# Patient Record
Sex: Female | Born: 1970 | Race: Black or African American | Hispanic: No | Smoking: Never smoker
Health system: Southern US, Community
[De-identification: ages and names within clinical notes are randomized; demographics above are authoritative.]

## PROBLEM LIST (undated history)

## (undated) DIAGNOSIS — I10 Essential (primary) hypertension: Secondary | ICD-10-CM

## (undated) DIAGNOSIS — D259 Leiomyoma of uterus, unspecified: Secondary | ICD-10-CM

## (undated) DIAGNOSIS — F419 Anxiety disorder, unspecified: Secondary | ICD-10-CM

## (undated) DIAGNOSIS — E785 Hyperlipidemia, unspecified: Secondary | ICD-10-CM

## (undated) DIAGNOSIS — F32A Depression, unspecified: Secondary | ICD-10-CM

## (undated) HISTORY — PX: MYOMECTOMY VAGINAL APPROACH: SUR871

## (undated) HISTORY — DX: Hyperlipidemia, unspecified: E78.5

## (undated) HISTORY — PX: ABDOMINAL HYSTERECTOMY: SHX81

## (undated) HISTORY — PX: TUBAL LIGATION: SHX77

## (undated) HISTORY — PX: OTHER SURGICAL HISTORY: SHX169

---

## 2001-10-13 ENCOUNTER — Emergency Department (HOSPITAL_COMMUNITY): Admission: EM | Admit: 2001-10-13 | Discharge: 2001-10-13 | Payer: Self-pay

## 2002-02-07 ENCOUNTER — Emergency Department (HOSPITAL_COMMUNITY): Admission: EM | Admit: 2002-02-07 | Discharge: 2002-02-07 | Payer: Self-pay | Admitting: Emergency Medicine

## 2003-12-20 ENCOUNTER — Emergency Department (HOSPITAL_COMMUNITY): Admission: EM | Admit: 2003-12-20 | Discharge: 2003-12-20 | Payer: Self-pay | Admitting: Emergency Medicine

## 2004-06-04 ENCOUNTER — Emergency Department (HOSPITAL_COMMUNITY): Admission: EM | Admit: 2004-06-04 | Discharge: 2004-06-04 | Payer: Self-pay | Admitting: *Deleted

## 2010-12-27 ENCOUNTER — Other Ambulatory Visit: Payer: Self-pay | Admitting: Family Medicine

## 2011-02-19 HISTORY — PX: BREAST BIOPSY: SHX20

## 2011-07-25 ENCOUNTER — Other Ambulatory Visit: Payer: Self-pay | Admitting: Radiology

## 2013-07-05 ENCOUNTER — Other Ambulatory Visit (HOSPITAL_COMMUNITY): Payer: Self-pay | Admitting: Obstetrics & Gynecology

## 2013-07-05 DIAGNOSIS — IMO0002 Reserved for concepts with insufficient information to code with codable children: Secondary | ICD-10-CM

## 2013-07-09 ENCOUNTER — Ambulatory Visit (HOSPITAL_COMMUNITY)
Admission: RE | Admit: 2013-07-09 | Discharge: 2013-07-09 | Disposition: A | Discharge status: Home / Self Care | Payer: BC Managed Care – PPO | Source: Ambulatory Visit | Attending: Obstetrics & Gynecology | Admitting: Obstetrics & Gynecology

## 2013-07-09 DIAGNOSIS — Z9851 Tubal ligation status: Secondary | ICD-10-CM | POA: Insufficient documentation

## 2013-07-09 DIAGNOSIS — N971 Female infertility of tubal origin: Secondary | ICD-10-CM | POA: Insufficient documentation

## 2013-07-09 DIAGNOSIS — IMO0002 Reserved for concepts with insufficient information to code with codable children: Secondary | ICD-10-CM

## 2013-07-09 MED ORDER — IOHEXOL 300 MG/ML  SOLN
20.0000 mL | Freq: Once | INTRAMUSCULAR | Status: AC | PRN
  Administered 2013-07-09: 20 mL

## 2013-11-23 ENCOUNTER — Encounter: Payer: Self-pay | Admitting: Family

## 2013-11-23 ENCOUNTER — Ambulatory Visit (INDEPENDENT_AMBULATORY_CARE_PROVIDER_SITE_OTHER): Payer: BC Managed Care – PPO | Admitting: Family

## 2013-11-23 VITALS — BP 120/76 | HR 91 | Ht 62.5 in | Wt 188.4 lb

## 2013-11-23 DIAGNOSIS — L219 Seborrheic dermatitis, unspecified: Secondary | ICD-10-CM

## 2013-11-23 DIAGNOSIS — B356 Tinea cruris: Secondary | ICD-10-CM

## 2013-11-23 MED ORDER — CLOTRIMAZOLE-BETAMETHASONE 1-0.05 % EX CREA: 1.0000 "application " | TOPICAL_CREAM | Freq: Two times a day (BID) | CUTANEOUS | Status: DC

## 2013-11-23 NOTE — Patient Instructions (Signed)
Yeast Infection of the Skin Some yeast on the skin is normal, but sometimes it causes an infection. If you have a yeast infection, it shows up as white or light brown patches on brown skin. You can see it better in the summer on tan skin. It causes light-colored holes in your suntan. It can happen on any area of the body. This cannot be passed from person to person. HOME CARE  Scrub your skin daily with a dandruff shampoo. Your rash may take a couple weeks to get well.  Do not scratch or itch the rash. GET HELP RIGHT AWAY IF:   You get another infection from scratching. The skin may get warm, red, and may ooze fluid.  The infection does not seem to be getting better. MAKE SURE YOU:  Understand these instructions.  Will watch your condition.  Will get help right away if you are not doing well or get worse. Document Released: 01/18/2008 Document Revised: 04/29/2011 Document Reviewed: 01/18/2008 Exit[employer] Patient Information 2015 [employer]. This information is not intended to replace advice given to you by your health care provider. Make sure you discuss any questions you have with your health care provider.  

## 2013-11-23 NOTE — Progress Notes (Signed)
Subjective:    Patient ID: Audrey Bates, female    DOB: [birth year], 43 y.o.   MRN: [id number]  HPI  43 year old african american, nonsmoker, is in today to be established. Has concerns of a rash in her groin area since June. Believes it may be worse after changing detergents. Decsribes as itchy, worse with sweating. Has been apply cortisone cream that has helped some but persist.   See GYN and last appt was in June. Last mammogram was July.   She is employed as a Education officer, museum.   Review of Systems  Constitutional: Negative.   HENT: Negative.   Eyes: Negative.   Respiratory: Negative.   Cardiovascular: Negative.   Gastrointestinal: Negative.   Endocrine: Negative.   Genitourinary: Negative.   Musculoskeletal: Negative.   Skin: Positive for rash.  Allergic/Immunologic: Negative.   Neurological: Negative.   Hematological: Negative.   Psychiatric/Behavioral: Negative.    History reviewed. No pertinent past medical history.  History   Social History  . Marital Status: Married    Spouse Name: N/A    Number of Children: N/A  . Years of Education: N/A   Occupational History  . Not on file.   Social History Main Topics  . Smoking status: Never Smoker   . Smokeless tobacco: Never Used  . Alcohol Use: No  . Drug Use: No  . Sexual Activity: Not on file   Other Topics Concern  . Not on file   Social History Narrative  . No narrative on file    Past Surgical History  Procedure Laterality Date  . Breast surgery      biopsy    Family History  Problem Relation Age of Onset  . Hypertension Mother     Allergies  Allergen Reactions  . Latex   . Penicillins     No current outpatient prescriptions on file prior to visit.   No current facility-administered medications on file prior to visit.    BP 120/76  Pulse 91  Ht 5' 2.5" (1.588 m)  Wt 188 lb 6.4 oz (85.458 kg)  BMI 33.89 kg/m2chart    Objective:   Physical Exam  Constitutional: She is oriented to  person, place, and time. She appears well-developed and well-nourished.  HENT:  Right Ear: External ear normal.  Left Ear: External ear normal.  Nose: Nose normal.  Mouth/Throat: Oropharynx is clear and moist.  Eyes: Conjunctivae and EOM are normal. Pupils are equal, round, and reactive to light.  Neck: Normal range of motion. Neck supple. No thyromegaly present.  Cardiovascular: Normal rate, regular rhythm and normal heart sounds.   Pulmonary/Chest: Effort normal and breath sounds normal.  Abdominal: Soft. Bowel sounds are normal.  Musculoskeletal: Normal range of motion.  Neurological: She is alert and oriented to person, place, and time. She has normal reflexes.  Skin: Skin is warm and dry. Rash noted.  Rash in the groin area that is waxy, well defined, appears consistent with tinea.   Psychiatric: She has a normal mood and affect.          Assessment & Plan:  Audrey Bates was seen today for establish care.  Diagnoses and associated orders for this visit:  Tinea cruris  Seborrheic dermatitis  Other Orders - Discontinue: clotrimazole-betamethasone (LOTRISONE) cream; Apply 1 application topically 2 (two) times daily. - clotrimazole-betamethasone (LOTRISONE) cream; Apply 1 application topically 2 (two) times daily. - clotrimazole-betamethasone (LOTRISONE) cream; Apply 1 application topically 2 (two) times daily.   Call the office with any  questions or concerns. Recheck as scheduled and as needed.

## 2013-11-23 NOTE — Progress Notes (Signed)
Pre visit review using our clinic review tool, if applicable. No additional management support is needed unless otherwise documented below in the visit note. 

## 2013-11-24 DIAGNOSIS — B356 Tinea cruris: Secondary | ICD-10-CM | POA: Insufficient documentation

## 2013-11-25 ENCOUNTER — Telehealth: Payer: Self-pay | Admitting: Family

## 2013-11-25 NOTE — Telephone Encounter (Signed)
Pt wanted to let you know she had a biometric screening today and her BP was 139/99 and 144/99.

## 2013-11-26 NOTE — Telephone Encounter (Signed)
FYI

## 2013-11-26 NOTE — Telephone Encounter (Signed)
Recheck blood pressure 3 times a week and call with readings in 2 weeks.

## 2013-11-26 NOTE — Telephone Encounter (Signed)
Left message to advise pt of note 

## 2013-12-16 ENCOUNTER — Encounter: Payer: Self-pay | Admitting: Family

## 2013-12-20 ENCOUNTER — Encounter: Payer: Self-pay | Admitting: Family

## 2013-12-29 ENCOUNTER — Ambulatory Visit (HOSPITAL_BASED_OUTPATIENT_CLINIC_OR_DEPARTMENT_OTHER)
Admission: RE | Admit: 2013-12-29 | Payer: BC Managed Care – PPO | Source: Ambulatory Visit | Admitting: Obstetrics and Gynecology

## 2013-12-29 ENCOUNTER — Encounter (HOSPITAL_BASED_OUTPATIENT_CLINIC_OR_DEPARTMENT_OTHER): Admission: RE | Payer: Self-pay | Source: Ambulatory Visit

## 2013-12-29 SURGERY — LAPAROSCOPY OPERATIVE
Anesthesia: General

## 2014-02-28 ENCOUNTER — Encounter (HOSPITAL_BASED_OUTPATIENT_CLINIC_OR_DEPARTMENT_OTHER): Payer: Self-pay | Admitting: *Deleted

## 2014-03-01 ENCOUNTER — Encounter (HOSPITAL_BASED_OUTPATIENT_CLINIC_OR_DEPARTMENT_OTHER): Payer: Self-pay | Admitting: *Deleted

## 2014-03-03 ENCOUNTER — Encounter (HOSPITAL_BASED_OUTPATIENT_CLINIC_OR_DEPARTMENT_OTHER): Admission: RE | Payer: Self-pay | Source: Ambulatory Visit

## 2014-03-03 ENCOUNTER — Ambulatory Visit (HOSPITAL_BASED_OUTPATIENT_CLINIC_OR_DEPARTMENT_OTHER)
Admission: RE | Admit: 2014-03-03 | Payer: BLUE CROSS/BLUE SHIELD | Source: Ambulatory Visit | Admitting: Obstetrics and Gynecology

## 2014-03-03 HISTORY — DX: Leiomyoma of uterus, unspecified: D25.9

## 2014-03-03 SURGERY — LAPAROSCOPY OPERATIVE
Anesthesia: General

## 2014-05-26 ENCOUNTER — Ambulatory Visit: Payer: BLUE CROSS/BLUE SHIELD | Admitting: Family Medicine

## 2014-08-04 ENCOUNTER — Encounter: Payer: Self-pay | Admitting: Family Medicine

## 2014-08-04 ENCOUNTER — Ambulatory Visit (INDEPENDENT_AMBULATORY_CARE_PROVIDER_SITE_OTHER): Payer: BLUE CROSS/BLUE SHIELD | Admitting: Family Medicine

## 2014-08-04 VITALS — BP 102/80 | HR 104 | Temp 98.0°F | Ht 62.5 in | Wt 194.6 lb

## 2014-08-04 DIAGNOSIS — N951 Menopausal and female climacteric states: Secondary | ICD-10-CM | POA: Diagnosis not present

## 2014-08-04 DIAGNOSIS — Z7689 Persons encountering health services in other specified circumstances: Secondary | ICD-10-CM

## 2014-08-04 DIAGNOSIS — E785 Hyperlipidemia, unspecified: Secondary | ICD-10-CM | POA: Diagnosis not present

## 2014-08-04 DIAGNOSIS — Z6835 Body mass index (BMI) 35.0-35.9, adult: Secondary | ICD-10-CM

## 2014-08-04 DIAGNOSIS — Z7189 Other specified counseling: Secondary | ICD-10-CM | POA: Diagnosis not present

## 2014-08-04 DIAGNOSIS — R232 Flushing: Secondary | ICD-10-CM

## 2014-08-04 NOTE — Patient Instructions (Addendum)
BEFORE YOU LEAVE: Physical in about 4-6 months - come fasting but drink plenty of water  See your gynecologist about the hotflashes  Please obtain copies of any labs and studies done in the last 6 months and forward a copy to [place]. Thank you.  We recommend the following healthy lifestyle measures: - eat a healthy diet consisting of lots of vegetables, fruits, beans, nuts, seeds, healthy meats such as white chicken and fish and whole grains.  - avoid fried foods, fast food, processed foods, sodas, red meet and other fattening foods.  - get a least 150 minutes of aerobic exercise per week.

## 2014-08-04 NOTE — Progress Notes (Signed)
HPI:  Audrey Bates is here to establish care.  She was going to [facility] ob/gyn for physicals, had fibroid removed Jan 2016 - reports perimenopausal now with hot flashes. On OCP. Had extensive labs and CT of her abd last month (06/2014) during fertility eval and everything was normal. Reports had cholesterol, diabetes screening and thyroid labs all in the last few months and mostly normal other then bad cholesterol mildly elevated. Mildly elevated BP in the past 140s when stress, but in the 120s/70s lately.   ROS negative for unless reported above: fevers, unintentional weight loss, hearing or vision loss, chest pain, palpitations, struggling to breath, hemoptysis, melena, hematochezia, hematuria, falls, loc, si, thoughts of self harm  Past Medical History  Diagnosis Date  . Uterine fibroid   . Hyperlipemia     Past Surgical History  Procedure Laterality Date  . Breast biopsy  2013    fibroadenoma  . Tubal ligation      reanastamosis in 2014    Family History  Problem Relation Age of Onset  . Hypertension Mother     History   Social History  . Marital Status: Married    Spouse Name: N/A  . Number of Children: N/A  . Years of Education: N/A   Social History Main Topics  . Smoking status: Never Smoker   . Smokeless tobacco: Never Used  . Alcohol Use: No  . Drug Use: No  . Sexual Activity: Yes    Birth Control/ Protection: Pill   Other Topics Concern  . 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  clotrimazole-betamethasone (LOTRISONE) cream, Apply 1 application topically 2 (two) times daily., Disp: 45 g, Rfl: 0 .  GILDESS 1/20 1-20 MG-MCG tablet, Take 1 tablet by mouth daily. , Disp: , Rfl: 0  EXAM:  Filed Vitals:   08/04/14 1611  BP: 102/80  Pulse: 104  Temp: 98 F (36.7 C)    Body mass index is 35 kg/(m^2).  GENERAL: vitals reviewed and listed above, alert, oriented, appears well hydrated and in no acute distress  HEENT: atraumatic, conjunttiva clear, no obvious abnormalities on inspection of external nose and ears  NECK: no obvious masses on inspection  LUNGS: clear to auscultation bilaterally, no wheezes, rales or rhonchi, good air movement  CV: HRRR, no peripheral edema  MS: moves all extremities without noticeable abnormality  PSYCH: pleasant and cooperative, no obvious depression or anxiety  ASSESSMENT AND PLAN:  Discussed the following assessment and plan:  Hyperlipemia -advised pt to obtain copies of recent labs for our review  -lifestyle recs -recheck lipids at CPE in 4-6 months  BMI 35.0-35.9,adult -lifestyle recs  Hot flashes -sees gyn  Encounter to establish care -We reviewed the PMH, PSH, FH, SH, Meds and Allergies. -We provided refills for any medications we will prescribe as needed. -We addressed current concerns per orders and patient instructions. -We have asked for records for pertinent exams, studies, vaccines and notes from previous providers. -We have advised patient to follow up per instructions below.   -Patient advised to return or notify a doctor immediately if symptoms worsen or persist or new concerns arise.  Patient Instructions  Please  obtain copies of any labs and studies done in the last 6 months and forward a copy to [place]. Thank you.  We recommend the following healthy lifestyle measures: - eat a healthy diet consisting of lots of vegetables, fruits, beans, nuts, seeds, healthy meats such as white chicken and fish and whole grains.  - avoid fried foods, fast food, processed foods, sodas, red meet and other fattening foods.  - get a least 150 minutes of aerobic exercise per week.        Colin Benton R.

## 2014-08-04 NOTE — Progress Notes (Signed)
Pre visit review using our clinic review tool, if applicable. No additional management support is needed unless otherwise documented below in the visit note. 

## 2014-09-12 ENCOUNTER — Ambulatory Visit (INDEPENDENT_AMBULATORY_CARE_PROVIDER_SITE_OTHER): Payer: BLUE CROSS/BLUE SHIELD | Admitting: Family Medicine

## 2014-09-12 ENCOUNTER — Encounter: Payer: Self-pay | Admitting: Family Medicine

## 2014-09-12 VITALS — BP 128/80 | HR 89 | Temp 98.4°F | Ht 62.5 in | Wt 194.1 lb

## 2014-09-12 DIAGNOSIS — J069 Acute upper respiratory infection, unspecified: Secondary | ICD-10-CM

## 2014-09-12 MED ORDER — HYDROCODONE-HOMATROPINE 5-1.5 MG/5ML PO SYRP: 5.0000 mL | ORAL_SOLUTION | Freq: Every evening | ORAL | Status: DC | PRN

## 2014-09-12 NOTE — Patient Instructions (Signed)

## 2014-09-12 NOTE — Progress Notes (Signed)
Pre visit review using our clinic review tool, if applicable. No additional management support is needed unless otherwise documented below in the visit note. 

## 2014-09-12 NOTE — Progress Notes (Signed)
   HPI:  URI: -started: 5 days ago -symptoms:nasal congestion, sore throat, cough, drainage -denies: fever, SOB, NVD, tooth pain, wheezing -has tried: some otc cough medications -sick contacts/travel/risks: denies flu exposure, tick exposure or or Ebola risks -supervisor has the same -Hx of: hx of PND and cough in the late summer every year  ROS: See pertinent positives and negatives per HPI.  Past Medical History  Diagnosis Date  . Uterine fibroid   . Hyperlipemia     Past Surgical History  Procedure Laterality Date  . Breast biopsy  2013    fibroadenoma  . Tubal ligation      reanastamosis in 2014    Family History  Problem Relation Age of Onset  . Hypertension Mother     History   Social History  . Marital Status: Married    Spouse Name: N/A  . Number of Children: N/A  . Years of Education: N/A   Social History Main Topics  . Smoking status: Never Smoker   . Smokeless tobacco: Never Used  . Alcohol Use: No  . Drug Use: No  . Sexual Activity: Yes    Birth Control/ Protection: Pill   Other Topics Concern  . 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  clotrimazole-betamethasone (LOTRISONE) cream, Apply 1 application topically 2 (two) times daily., Disp: 45 g, Rfl: 0 .  HYDROcodone-homatropine (HYCODAN) 5-1.5 MG/5ML syrup, Take 5 mLs by mouth at bedtime as needed for cough., Disp: 120 mL, Rfl: 0  EXAM:  Filed Vitals:   09/12/14 1330  BP: 128/80  Pulse: 89  Temp: 98.4 F (36.9 C)    Body mass index is 34.91 kg/(m^2).  GENERAL: vitals reviewed and listed above, alert, oriented, appears well hydrated and in no acute distress  HEENT: atraumatic, conjunttiva clear, no obvious abnormalities on inspection of  external nose and ears, normal appearance of ear canals and TMs, clear nasal congestion, mild post oropharyngeal erythema with PND, no tonsillar edema or exudate, no sinus TTP  NECK: no obvious masses on inspection  LUNGS: clear to auscultation bilaterally, no wheezes, rales or rhonchi, good air movement  CV: HRRR, no peripheral edema  MS: moves all extremities without noticeable abnormality  PSYCH: pleasant and cooperative, no obvious depression or anxiety  ASSESSMENT AND PLAN:  Discussed the following assessment and plan:  Acute upper respiratory infection - Plan: HYDROcodone-homatropine (HYCODAN) 5-1.5 MG/5ML syrup  -given HPI and exam findings today, a serious infection or illness is unlikely. We discussed potential etiologies, with VURI being most likely, and advised supportive care and monitoring. We discussed treatment side effects, likely course, antibiotic misuse, transmission, and signs of developing a serious illness. -of course, we advised to return or notify a doctor immediately if symptoms worsen or persist or new concerns arise.    There are no Patient Instructions on file for this visit.   Colin Benton R.

## 2014-10-10 ENCOUNTER — Ambulatory Visit (INDEPENDENT_AMBULATORY_CARE_PROVIDER_SITE_OTHER): Payer: BLUE CROSS/BLUE SHIELD | Admitting: Family Medicine

## 2014-10-10 ENCOUNTER — Ambulatory Visit (INDEPENDENT_AMBULATORY_CARE_PROVIDER_SITE_OTHER)
Admission: RE | Admit: 2014-10-10 | Discharge: 2014-10-10 | Disposition: A | Discharge status: Home / Self Care | Payer: BLUE CROSS/BLUE SHIELD | Source: Ambulatory Visit | Attending: Family Medicine | Admitting: Family Medicine

## 2014-10-10 ENCOUNTER — Encounter: Payer: Self-pay | Admitting: Family Medicine

## 2014-10-10 VITALS — BP 126/78 | HR 90 | Temp 97.5°F | Ht 62.5 in | Wt 195.5 lb

## 2014-10-10 DIAGNOSIS — R059 Cough, unspecified: Secondary | ICD-10-CM

## 2014-10-10 DIAGNOSIS — R05 Cough: Secondary | ICD-10-CM

## 2014-10-10 MED ORDER — PREDNISONE 20 MG PO TABS: 40.0000 mg | ORAL_TABLET | Freq: Every day | ORAL | Status: DC

## 2014-10-10 NOTE — Progress Notes (Signed)
   HPI:  URI: -started:about 1 month ago, seemed to be getting a little better but then worse again the last week -symptoms:nasal congestion, sore throat, cough, cough is the worse, low grade fever last week -denies: SOB, NVD, tooth pain, sinus pain -has tried: cough medicine -sick contacts/travel/risks: denies flu exposure, tick exposure or or Ebola risks   ROS: See pertinent positives and negatives per HPI.  Past Medical History  Diagnosis Date  . Uterine fibroid   . Hyperlipemia     Past Surgical History  Procedure Laterality Date  . Breast biopsy  2013    fibroadenoma  . Tubal ligation      reanastamosis in 2014    Family History  Problem Relation Age of Onset  . Hypertension Mother     Social History   Social History  . Marital Status: Married    Spouse Name: N/A  . Number of Children: N/A  . Years of Education: N/A   Social History Main Topics  . Smoking status: Never Smoker   . Smokeless tobacco: Never Used  . Alcohol Use: No  . Drug Use: No  . Sexual Activity: Yes    Birth Control/ Protection: Pill   Other Topics Concern  . 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  clotrimazole-betamethasone (LOTRISONE) cream, Apply 1 application topically 2 (two) times daily., Disp: 45 g, Rfl: 0 .  predniSONE (DELTASONE) 20 MG tablet, Take 2 tablets (40 mg total) by mouth daily with breakfast., Disp: 8 tablet, Rfl: 0  EXAM:  Filed Vitals:   10/10/14 1455  BP: 126/78  Pulse: 90  Temp: 97.5 F (36.4 C)    Body mass index is 35.17 kg/(m^2).  GENERAL: vitals reviewed and listed above, alert, oriented, appears well hydrated and in no acute distress  HEENT: atraumatic, conjunttiva clear, no obvious abnormalities  on inspection of external nose and ears, normal appearance of ear canals and TMs, clear nasal congestion, mild post oropharyngeal erythema with PND, no tonsillar edema or exudate, no sinus TTP  NECK: no obvious masses on inspection  LUNGS: clear to auscultation bilaterally, no wheezes, rales or rhonchi, good air movement  CV: HRRR, no peripheral edema  MS: moves all extremities without noticeable abnormality  PSYCH: pleasant and cooperative, no obvious depression or anxiety  ASSESSMENT AND PLAN:  Discussed the following assessment and plan:  Cough - Plan: DG Chest 2 View  -suspect allergic or post viral - prednisone -cxr to exclude other -return precautions -of course, we advised to return or notify a doctor immediately if symptoms worsen or persist or new concerns arise.    Patient Instructions  BEFORE YOU LEAVE: -xray sheet  Go get the xray today  Take the prednisone as instructed  Follow up in 1 week if not better or sooner if worse       KIM, HANNAH R.

## 2014-10-10 NOTE — Progress Notes (Signed)
Pre visit review using our clinic review tool, if applicable. No additional management support is needed unless otherwise documented below in the visit note. 

## 2014-10-10 NOTE — Patient Instructions (Signed)
BEFORE YOU LEAVE: -xray sheet  Go get the xray today  Take the prednisone as instructed  Follow up in 1 week if not better or sooner if worse

## 2014-10-17 ENCOUNTER — Ambulatory Visit (INDEPENDENT_AMBULATORY_CARE_PROVIDER_SITE_OTHER): Payer: BLUE CROSS/BLUE SHIELD | Admitting: Family Medicine

## 2014-10-17 ENCOUNTER — Encounter: Payer: Self-pay | Admitting: Family Medicine

## 2014-10-17 VITALS — BP 100/72 | HR 119 | Temp 97.9°F | Ht 62.5 in | Wt 195.1 lb

## 2014-10-17 DIAGNOSIS — J309 Allergic rhinitis, unspecified: Secondary | ICD-10-CM | POA: Diagnosis not present

## 2014-10-17 NOTE — Progress Notes (Signed)
  HPI:  Follow up Bronchitis: -reports doing MUCH better -mild PND, clear nasal congestion, sneezing but much better -denies: cough, sinus pain, SOB, fevers, malaise -cxr normal  ROS: See pertinent positives and negatives per HPI.  Past Medical History  Diagnosis Date  . Uterine fibroid   . Hyperlipemia     Past Surgical History  Procedure Laterality Date  . Breast biopsy  2013    fibroadenoma  . Tubal ligation      reanastamosis in 2014    Family History  Problem Relation Age of Onset  . Hypertension Mother     Social History   Social History  . Marital Status: Married    Spouse Name: N/A  . Number of Children: N/A  . Years of Education: N/A   Social History Main Topics  . Smoking status: Never Smoker   . Smokeless tobacco: Never Used  . Alcohol Use: No  . Drug Use: No  . Sexual Activity: Yes    Birth Control/ Protection: Pill   Other Topics Concern  . 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  clotrimazole-betamethasone (LOTRISONE) cream, Apply 1 application topically 2 (two) times daily., Disp: 45 g, Rfl: 0  EXAM:  Filed Vitals:   10/17/14 1338  BP: 100/72  Pulse: 119  Temp: 97.9 F (36.6 C)    Body mass index is 35.09 kg/(m^2).  GENERAL: vitals reviewed and listed above, alert, oriented, appears well hydrated and in no acute distress  HEENT: atraumatic, conjunttiva clear, no obvious abnormalities on inspection of external nose and ears, ear canals clear, TMs normal, boggy pale turbinates with clear rhinorrhea, normal oropharynx  NECK: no obvious masses on inspection  LUNGS: clear to auscultation bilaterally, no wheezes, rales or rhonchi, good air movement  CV: HRRR, no peripheral edema  MS:  moves all extremities without noticeable abnormality  PSYCH: pleasant and cooperative, no obvious depression or anxiety  ASSESSMENT AND PLAN:  Discussed the following assessment and plan:  Allergic rhinitis, unspecified allergic rhinitis type  -Patient advised to return or notify a doctor immediately if symptoms worsen or persist or new concerns arise.  Patient Instructions  flonase 2 sprays each nostril daily for 1 month, then 1 spray each nostril daily for 1 month  Allegra daily for 2 months  Follow up as needed and for PHYSICAL EXAM in 4 months, come fasting to that appointment, please schedule as you leave if you wish.     Colin Benton R.

## 2014-10-17 NOTE — Patient Instructions (Signed)
flonase 2 sprays each nostril daily for 1 month, then 1 spray each nostril daily for 1 month  Allegra daily for 2 months  Follow up as needed and for PHYSICAL EXAM in 4 months, come fasting to that appointment, please schedule as you leave if you wish.

## 2014-10-17 NOTE — Progress Notes (Signed)
Pre visit review using our clinic review tool, if applicable. No additional management support is needed unless otherwise documented below in the visit note. 

## 2014-11-22 DIAGNOSIS — N840 Polyp of corpus uteri: Secondary | ICD-10-CM | POA: Insufficient documentation

## 2015-03-27 DIAGNOSIS — Z975 Presence of (intrauterine) contraceptive device: Secondary | ICD-10-CM | POA: Insufficient documentation

## 2015-12-19 ENCOUNTER — Emergency Department (HOSPITAL_COMMUNITY): Payer: No Typology Code available for payment source

## 2015-12-19 ENCOUNTER — Encounter (HOSPITAL_COMMUNITY): Payer: Self-pay | Admitting: Emergency Medicine

## 2015-12-19 ENCOUNTER — Emergency Department (HOSPITAL_COMMUNITY)
Admission: EM | Admit: 2015-12-19 | Discharge: 2015-12-19 | Disposition: A | Discharge status: Home / Self Care | Payer: No Typology Code available for payment source | Attending: Emergency Medicine | Admitting: Emergency Medicine

## 2015-12-19 DIAGNOSIS — M545 Low back pain: Secondary | ICD-10-CM | POA: Diagnosis present

## 2015-12-19 DIAGNOSIS — Y92410 Unspecified street and highway as the place of occurrence of the external cause: Secondary | ICD-10-CM | POA: Insufficient documentation

## 2015-12-19 DIAGNOSIS — Z79899 Other long term (current) drug therapy: Secondary | ICD-10-CM | POA: Diagnosis not present

## 2015-12-19 DIAGNOSIS — Y9389 Activity, other specified: Secondary | ICD-10-CM | POA: Diagnosis not present

## 2015-12-19 DIAGNOSIS — Y999 Unspecified external cause status: Secondary | ICD-10-CM | POA: Diagnosis not present

## 2015-12-19 DIAGNOSIS — I10 Essential (primary) hypertension: Secondary | ICD-10-CM | POA: Diagnosis not present

## 2015-12-19 HISTORY — DX: Essential (primary) hypertension: I10

## 2015-12-19 LAB — POC URINE PREG, ED: Preg Test, Ur: NEGATIVE

## 2015-12-19 MED ORDER — NAPROXEN 500 MG PO TABS: 500.0000 mg | ORAL_TABLET | Freq: Two times a day (BID) | ORAL | 0 refills | Status: DC

## 2015-12-19 MED ORDER — CYCLOBENZAPRINE HCL 5 MG PO TABS: 5.0000 mg | ORAL_TABLET | Freq: Three times a day (TID) | ORAL | 0 refills | Status: DC | PRN

## 2015-12-19 MED ORDER — IBUPROFEN 200 MG PO TABS
600.0000 mg | ORAL_TABLET | Freq: Once | ORAL | Status: AC
  Administered 2015-12-19: 600 mg via ORAL
  Filled 2015-12-19: qty 3
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
## 2015-12-19 NOTE — Discharge Instructions (Signed)
Read the information below.  Your x-rays were re-assuring.  You may feel sore for the next 2-3 days. I have prescribed naprosyn and flexeril for relief. While taking naprosyn do not take other NSAIDs (ibuprofen, motrin, or aleve). Flexeril can make you drowsy, do not drive after taking.  You can apply heat/ice to affected areas for 20 minute increments.  Warm showers can soothe sore muscles.  If symptoms persist for more than a week follow up with your primary provider.  Use the prescribed medication as directed.  Please discuss all new medications with your pharmacist.   You may return to the Emergency Department at any time for worsening condition or any new symptoms that concern you.
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
## 2016-07-02 ENCOUNTER — Encounter
Admission: RE | Admit: 2016-07-02 | Discharge: 2016-07-02 | Disposition: A | Discharge status: Home / Self Care | Payer: 59 | Source: Ambulatory Visit | Attending: Obstetrics & Gynecology | Admitting: Obstetrics & Gynecology

## 2016-07-02 DIAGNOSIS — Z01812 Encounter for preprocedural laboratory examination: Secondary | ICD-10-CM | POA: Diagnosis not present

## 2016-07-02 DIAGNOSIS — N920 Excessive and frequent menstruation with regular cycle: Secondary | ICD-10-CM | POA: Insufficient documentation

## 2016-07-02 DIAGNOSIS — R9431 Abnormal electrocardiogram [ECG] [EKG]: Secondary | ICD-10-CM | POA: Diagnosis not present

## 2016-07-02 DIAGNOSIS — I10 Essential (primary) hypertension: Secondary | ICD-10-CM | POA: Insufficient documentation

## 2016-07-02 DIAGNOSIS — Z01818 Encounter for other preprocedural examination: Secondary | ICD-10-CM | POA: Insufficient documentation

## 2016-07-02 DIAGNOSIS — N946 Dysmenorrhea, unspecified: Secondary | ICD-10-CM | POA: Insufficient documentation

## 2016-07-02 DIAGNOSIS — Z0183 Encounter for blood typing: Secondary | ICD-10-CM | POA: Diagnosis not present

## 2016-07-02 LAB — CBC
HEMATOCRIT: 38.5 % (ref 35.0–47.0)
HEMOGLOBIN: 12.9 g/dL (ref 12.0–16.0)
MCH: 29.8 pg (ref 26.0–34.0)
MCHC: 33.4 g/dL (ref 32.0–36.0)
MCV: 89.3 fL (ref 80.0–100.0)
Platelets: 304 10*3/uL (ref 150–440)
RBC: 4.31 MIL/uL (ref 3.80–5.20)
RDW: 13.5 % (ref 11.5–14.5)
WBC: 5.5 10*3/uL (ref 3.6–11.0)

## 2016-07-02 LAB — TYPE AND SCREEN
ABO/RH(D): A POS
ANTIBODY SCREEN: NEGATIVE

## 2016-07-02 LAB — BASIC METABOLIC PANEL
Anion gap: 5 (ref 5–15)
BUN: 14 mg/dL (ref 6–20)
CALCIUM: 8.6 mg/dL — AB (ref 8.9–10.3)
CHLORIDE: 104 mmol/L (ref 101–111)
CO2: 27 mmol/L (ref 22–32)
CREATININE: 0.55 mg/dL (ref 0.44–1.00)
GFR calc non Af Amer: >60 mL/min (ref >60)
Glucose, Bld: 90 mg/dL (ref 65–99)
Potassium: 3.8 mmol/L (ref 3.5–5.1)
SODIUM: 136 mmol/L (ref 135–145)

## 2016-07-02 NOTE — Patient Instructions (Signed)
Your procedure is scheduled on: 07/08/16 Report to [facility]. 2ND FLOOR MEDICAL MALL ENTRANCE. To find out your arrival time please call [phone] between 1PM - 3PM on 07/05/16.  Remember: Instructions that are not followed completely may result in serious medical risk, up to and including death, or upon the discretion of your surgeon and anesthesiologist your surgery may need to be rescheduled.    __X__ 1. Do not eat food or drink liquids after midnight. No gum chewing or hard candies.     __X__ 2. No Alcohol for 24 hours before or after surgery.   ____ 3. Bring all medications with you on the day of surgery if instructed.    __X__ 4. Notify your doctor if there is any change in your medical condition     (cold, fever, infections).             ___X__5. No smoking within 24 hours of your surgery.     Do not wear jewelry, make-up, hairpins, clips or nail polish.  Do not wear lotions, powders, or perfumes.   Do not shave 48 hours prior to surgery. Men may shave face and neck.  Do not bring valuables to the hospital.    [facility] is not responsible for any belongings or valuables.               Contacts, dentures or bridgework may not be worn into surgery.  Leave your suitcase in the car. After surgery it may be brought to your room.  For patients admitted to the hospital, discharge time is determined by your                treatment team.   Patients discharged the day of surgery will not be allowed to drive home.   Please read over the following fact sheets that you were given:   MRSA Information   __X__ Take these medicines the morning of surgery with A SIP OF WATER:    1. LISINOPRIL  2.   3.   4.  5.  6.  ____ Fleet Enema (as directed)   __X__ Use CHG Soap as directed  ____ Use inhalers on the day of surgery  ____ Stop metformin 2 days prior to surgery    ____ Take 1/2 of usual insulin dose the night before surgery and none on the morning of surgery.   ____  Stop Coumadin/Plavix/aspirin on   __X__ Stop Anti-inflammatories such as Advil, Aleve, Ibuprofen, Motrin, Naproxen, Naprosyn, Goodies,powder, or aspirin products.  OK to take Tylenol.   __X__ Stop supplements until after surgery.  TODAY  ____ Bring C-Pap to the hospital.

## 2016-07-02 NOTE — H&P (Signed)
Patient ID: Audrey Bates is a 46 y.o. female presenting with Pre Op Consulting  on 07/02/2016  HPI: 46yo with long history of dysmenorrhea, menorrhagia, and fibroid uterus, unsatisfactorily responsive to hormonal manipulation and causing significant pain outside of menses.  She was initially interested in myomectomy, now is interested in hysterectomy.  Past Medical History:  has a past medical history of Disc degeneration, lumbar; Fibroid; Headache; Hiatal hernia; Hyperlipidemia, unspecified; and Hypertension.  Past Surgical History:  has a past surgical history that includes tubal reversal; fibroid removal; and Tubal ligation (1993). Family History: family history includes Alcohol abuse in her sister; Bipolar disorder in her brother and sister; Coronary Artery Disease (Blocked arteries around heart) in her mother; Hiatal hernia in her father; High blood pressure (Hypertension) in her mother, sister, and sister; Lumbar disc disease in her father; Schizophrenia in her brother; Stroke in her sister. Social History:  reports that she has never smoked. She has never used smokeless tobacco. She reports that she drinks alcohol. She reports that she does not use drugs. OB/GYN History:          OB History    Gravida Para Term Preterm AB Living   3 2 2   1 2    SAB TAB Ectopic Molar Multiple Live Births   1         2      Allergies: is allergic to latex and penicillin. Medications:  Current Outpatient Prescriptions:  .  cholecalciferol (CHOLECALCIFEROL) 1,000 unit tablet, Take 2,000 Units by mouth once daily., Disp: , Rfl:  .  DOCOSAHEXANOIC ACID/EPA (FISH OIL ORAL), Take by mouth., Disp: , Rfl:  .  lisinopril (PRINIVIL,ZESTRIL) 10 MG tablet, Take 1 tablet (10 mg total) by mouth once daily., Disp: 90 tablet, Rfl: 3   Review of Systems: No SOB, no palpitations or chest pain, no new lower extremity edema, no nausea or vomiting or bowel or bladder complaints. See HPI for gyn  specific ROS.   Exam:   BP 119/85   Pulse 85   Ht 158.8 cm (5' 2.5")   Wt 91.4 kg (201 lb 6.4 oz)   BMI 36.25 kg/m   General: Patient is well-groomed, well-nourished, appears stated age in no acute distress  HEENT: head is atraumatic and normocephalic, trachea is midline, neck is supple with no palpable nodules  CV: Regular rhythm and normal heart rate, no murmur  Pulm: Clear to auscultation throughout lung fields with no wheezing, crackles, or rhonchi. No increased work of breathing  Abdomen: soft , no mass, non-tender, no rebound tenderness, no hepatomegaly  Pelvic: tanner stage 5 ,              External genitalia: vulva /labia no lesions             Urethra: no prolapse             Vagina: normal physiologic d/c, laxity in vaginal walls             Cervix: no lesions, no cervical motion tenderness, good descent             Uterus: normal size shape and contour, non-tender             Adnexa: no mass,  non-tender               Rectovaginal: External wnl   Impression:   The primary encounter diagnosis was DUB (dysfunctional uterine bleeding). A diagnosis of Preoperative exam for gynecologic surgery was also pertinent  to this visit.    Plan:    She still desires a hysterectomy, and bilateral salpingectomy.  Ovaries to remain insitu.  The patient and I discussed the technical aspects of the procedure including the potential for risks and complications.These include but are not limited to the risk of infection requiring post-operative antibiotics or further procedures.We talked about the risk of injury to adjacent organs including bladder, bowel, ureter, blood vessels or nerves, the need to convert to an open incision, possibleneed for blood transfusion andpostop complications such asthromboembolic or cardiopulmonary complications.All of her questions were answered. Her preoperative exam was completed andthe appropriate consents were signed. She is  scheduled to undergo this procedure in the near future.

## 2016-07-02 NOTE — Pre-Procedure Instructions (Signed)
Dr. Rosey Bath reviewed EKG (07/02/16). No new orders. OK to proceed>

## 2016-07-07 MED ORDER — CLINDAMYCIN PHOSPHATE 900 MG/50ML IV SOLN
900.0000 mg | Freq: Once | INTRAVENOUS | Status: AC
  Administered 2016-07-08: 45 mg via INTRAVENOUS
  Administered 2016-07-08: 900 mg via INTRAVENOUS

## 2016-07-07 MED ORDER — GENTAMICIN SULFATE 40 MG/ML IJ SOLN
1.5000 mg/kg | Freq: Once | INTRAVENOUS | Status: AC
  Administered 2016-07-08: 140 mg via INTRAVENOUS
  Filled 2016-07-07: qty 3.5

## 2016-07-08 ENCOUNTER — Ambulatory Visit
Admission: RE | Admit: 2016-07-08 | Discharge: 2016-07-08 | Disposition: A | Discharge status: Home / Self Care | Payer: 59 | Source: Ambulatory Visit | Attending: Obstetrics & Gynecology | Admitting: Obstetrics & Gynecology

## 2016-07-08 ENCOUNTER — Encounter
Admission: RE | Disposition: A | Discharge status: Home / Self Care | Payer: Self-pay | Source: Ambulatory Visit | Attending: Obstetrics & Gynecology

## 2016-07-08 ENCOUNTER — Ambulatory Visit: Payer: 59 | Admitting: Anesthesiology

## 2016-07-08 DIAGNOSIS — N72 Inflammatory disease of cervix uteri: Secondary | ICD-10-CM | POA: Diagnosis not present

## 2016-07-08 DIAGNOSIS — D259 Leiomyoma of uterus, unspecified: Secondary | ICD-10-CM | POA: Diagnosis not present

## 2016-07-08 DIAGNOSIS — I10 Essential (primary) hypertension: Secondary | ICD-10-CM | POA: Insufficient documentation

## 2016-07-08 DIAGNOSIS — M5136 Other intervertebral disc degeneration, lumbar region: Secondary | ICD-10-CM | POA: Insufficient documentation

## 2016-07-08 DIAGNOSIS — E669 Obesity, unspecified: Secondary | ICD-10-CM | POA: Insufficient documentation

## 2016-07-08 DIAGNOSIS — Z9889 Other specified postprocedural states: Secondary | ICD-10-CM | POA: Insufficient documentation

## 2016-07-08 DIAGNOSIS — Z91040 Latex allergy status: Secondary | ICD-10-CM | POA: Insufficient documentation

## 2016-07-08 DIAGNOSIS — Z880 Allergy status to penicillin: Secondary | ICD-10-CM | POA: Diagnosis not present

## 2016-07-08 DIAGNOSIS — Z811 Family history of alcohol abuse and dependence: Secondary | ICD-10-CM | POA: Diagnosis not present

## 2016-07-08 DIAGNOSIS — E785 Hyperlipidemia, unspecified: Secondary | ICD-10-CM | POA: Diagnosis not present

## 2016-07-08 DIAGNOSIS — N920 Excessive and frequent menstruation with regular cycle: Secondary | ICD-10-CM | POA: Insufficient documentation

## 2016-07-08 DIAGNOSIS — N938 Other specified abnormal uterine and vaginal bleeding: Secondary | ICD-10-CM | POA: Diagnosis not present

## 2016-07-08 DIAGNOSIS — Z823 Family history of stroke: Secondary | ICD-10-CM | POA: Insufficient documentation

## 2016-07-08 DIAGNOSIS — Z8249 Family history of ischemic heart disease and other diseases of the circulatory system: Secondary | ICD-10-CM | POA: Diagnosis not present

## 2016-07-08 DIAGNOSIS — N946 Dysmenorrhea, unspecified: Secondary | ICD-10-CM | POA: Insufficient documentation

## 2016-07-08 DIAGNOSIS — Z6835 Body mass index (BMI) 35.0-35.9, adult: Secondary | ICD-10-CM | POA: Insufficient documentation

## 2016-07-08 DIAGNOSIS — Z818 Family history of other mental and behavioral disorders: Secondary | ICD-10-CM | POA: Insufficient documentation

## 2016-07-08 HISTORY — PX: LAPAROSCOPIC HYSTERECTOMY: SHX1926

## 2016-07-08 HISTORY — PX: LAPAROSCOPIC BILATERAL SALPINGECTOMY: SHX5889

## 2016-07-08 LAB — ABO/RH: ABO/RH(D): A POS

## 2016-07-08 LAB — POCT PREGNANCY, URINE: PREG TEST UR: NEGATIVE

## 2016-07-08 SURGERY — HYSTERECTOMY, TOTAL, LAPAROSCOPIC
Anesthesia: General | Wound class: Clean Contaminated

## 2016-07-08 MED ORDER — PROPOFOL 10 MG/ML IV BOLUS
INTRAVENOUS | Status: AC
Start: 2016-07-08 — End: 2016-07-08
  Filled 2016-07-08: qty 20

## 2016-07-08 MED ORDER — SUGAMMADEX SODIUM 200 MG/2ML IV SOLN
INTRAVENOUS | Status: DC | PRN
  Administered 2016-07-08: 200 mg via INTRAVENOUS

## 2016-07-08 MED ORDER — LIDOCAINE HCL (PF) 2 % IJ SOLN
INTRAMUSCULAR | Status: AC
  Filled 2016-07-08: qty 2

## 2016-07-08 MED ORDER — FAMOTIDINE 20 MG PO TABS
ORAL_TABLET | ORAL | Status: AC
  Administered 2016-07-08: 20 mg via ORAL
  Filled 2016-07-08: qty 1

## 2016-07-08 MED ORDER — MIDAZOLAM HCL 2 MG/2ML IJ SOLN
INTRAMUSCULAR | Status: DC | PRN
  Administered 2016-07-08: 2 mg via INTRAVENOUS

## 2016-07-08 MED ORDER — SUCCINYLCHOLINE CHLORIDE 20 MG/ML IJ SOLN
INTRAMUSCULAR | Status: AC
  Filled 2016-07-08: qty 1

## 2016-07-08 MED ORDER — LACTATED RINGERS IV SOLN
INTRAVENOUS | Status: DC | PRN
  Administered 2016-07-08 (×2): via INTRAVENOUS

## 2016-07-08 MED ORDER — CELECOXIB 200 MG PO CAPS
400.0000 mg | ORAL_CAPSULE | Freq: Once | ORAL | Status: AC
  Administered 2016-07-08: 400 mg via ORAL

## 2016-07-08 MED ORDER — OXYCODONE HCL 5 MG PO TABS: 5.0000 mg | ORAL_TABLET | ORAL | 0 refills | Status: AC | PRN

## 2016-07-08 MED ORDER — HEPARIN SODIUM (PORCINE) 5000 UNIT/ML IJ SOLN
5000.0000 [IU] | Freq: Once | INTRAMUSCULAR | Status: AC
  Administered 2016-07-08: 5000 [IU] via SUBCUTANEOUS

## 2016-07-08 MED ORDER — ACETAMINOPHEN 500 MG PO TABS
1000.0000 mg | ORAL_TABLET | Freq: Once | ORAL | Status: AC
  Administered 2016-07-08: 1000 mg via ORAL

## 2016-07-08 MED ORDER — LIDOCAINE HCL (CARDIAC) 20 MG/ML IV SOLN
INTRAVENOUS | Status: DC | PRN
  Administered 2016-07-08: 30 mg via INTRAVENOUS

## 2016-07-08 MED ORDER — SUCCINYLCHOLINE CHLORIDE 20 MG/ML IJ SOLN
INTRAMUSCULAR | Status: DC | PRN
Start: 2016-07-08 — End: 2016-07-08
  Administered 2016-07-08: 120 mg via INTRAVENOUS

## 2016-07-08 MED ORDER — SUGAMMADEX SODIUM 200 MG/2ML IV SOLN
INTRAVENOUS | Status: AC
  Filled 2016-07-08: qty 2

## 2016-07-08 MED ORDER — EVICEL 5 ML EX KIT
PACK | CUTANEOUS | Status: AC
  Filled 2016-07-08: qty 1

## 2016-07-08 MED ORDER — ONDANSETRON HCL 4 MG/2ML IJ SOLN
INTRAMUSCULAR | Status: AC
  Filled 2016-07-08: qty 2

## 2016-07-08 MED ORDER — FENTANYL CITRATE (PF) 100 MCG/2ML IJ SOLN
INTRAMUSCULAR | Status: AC
  Administered 2016-07-08: 25 ug via INTRAVENOUS
  Filled 2016-07-08: qty 2

## 2016-07-08 MED ORDER — ROCURONIUM BROMIDE 100 MG/10ML IV SOLN
INTRAVENOUS | Status: DC | PRN
  Administered 2016-07-08: 50 mg via INTRAVENOUS
  Administered 2016-07-08: 20 mg via INTRAVENOUS

## 2016-07-08 MED ORDER — EVICEL 5 ML EX KIT
PACK | CUTANEOUS | Status: DC | PRN
  Administered 2016-07-08: 5 mL

## 2016-07-08 MED ORDER — LACTATED RINGERS IV SOLN
INTRAVENOUS | Status: DC
  Administered 2016-07-08: 11:00:00 via INTRAVENOUS

## 2016-07-08 MED ORDER — DEXAMETHASONE SODIUM PHOSPHATE 10 MG/ML IJ SOLN
INTRAMUSCULAR | Status: DC | PRN
Start: 2016-07-08 — End: 2016-07-08
  Administered 2016-07-08: 10 mg via INTRAVENOUS

## 2016-07-08 MED ORDER — PHENYLEPHRINE HCL 10 MG/ML IJ SOLN
INTRAMUSCULAR | Status: DC | PRN
Start: 2016-07-08 — End: 2016-07-08
  Administered 2016-07-08: 100 ug via INTRAVENOUS
  Administered 2016-07-08: 200 ug via INTRAVENOUS
  Administered 2016-07-08 (×7): 100 ug via INTRAVENOUS

## 2016-07-08 MED ORDER — MIDAZOLAM HCL 2 MG/2ML IJ SOLN
INTRAMUSCULAR | Status: AC
  Filled 2016-07-08: qty 2

## 2016-07-08 MED ORDER — ROCURONIUM BROMIDE 50 MG/5ML IV SOLN
INTRAVENOUS | Status: AC
  Filled 2016-07-08: qty 1

## 2016-07-08 MED ORDER — CLINDAMYCIN PHOSPHATE 900 MG/50ML IV SOLN
INTRAVENOUS | Status: AC
  Filled 2016-07-08: qty 50

## 2016-07-08 MED ORDER — BUPIVACAINE HCL (PF) 0.5 % IJ SOLN
INTRAMUSCULAR | Status: AC
  Filled 2016-07-08: qty 30

## 2016-07-08 MED ORDER — FENTANYL CITRATE (PF) 100 MCG/2ML IJ SOLN
25.0000 ug | INTRAMUSCULAR | Status: DC | PRN
  Administered 2016-07-08 (×2): 25 ug via INTRAVENOUS

## 2016-07-08 MED ORDER — ONDANSETRON HCL 4 MG/2ML IJ SOLN: 4.0000 mg | Freq: Once | INTRAMUSCULAR | Status: DC | PRN

## 2016-07-08 MED ORDER — GABAPENTIN 300 MG PO CAPS
ORAL_CAPSULE | ORAL | Status: AC
  Administered 2016-07-08: 600 mg
  Filled 2016-07-08: qty 2

## 2016-07-08 MED ORDER — ACETAMINOPHEN 500 MG PO TABS
ORAL_TABLET | ORAL | Status: AC
  Administered 2016-07-08: 1000 mg via ORAL
  Filled 2016-07-08: qty 2

## 2016-07-08 MED ORDER — CELECOXIB 200 MG PO CAPS
ORAL_CAPSULE | ORAL | Status: AC
  Administered 2016-07-08: 400 mg via ORAL
  Filled 2016-07-08: qty 2

## 2016-07-08 MED ORDER — FENTANYL CITRATE (PF) 100 MCG/2ML IJ SOLN
INTRAMUSCULAR | Status: DC | PRN
  Administered 2016-07-08: 50 ug via INTRAVENOUS
  Administered 2016-07-08: 100 ug via INTRAVENOUS

## 2016-07-08 MED ORDER — GABAPENTIN 600 MG PO TABS
600.0000 mg | ORAL_TABLET | Freq: Once | ORAL | Status: DC
  Filled 2016-07-08: qty 1

## 2016-07-08 MED ORDER — ACETAMINOPHEN 10 MG/ML IV SOLN
INTRAVENOUS | Status: AC
  Filled 2016-07-08: qty 100

## 2016-07-08 MED ORDER — FAMOTIDINE 20 MG PO TABS
20.0000 mg | ORAL_TABLET | Freq: Once | ORAL | Status: AC
  Administered 2016-07-08: 20 mg via ORAL

## 2016-07-08 MED ORDER — IBUPROFEN 600 MG PO TABS: 600.0000 mg | ORAL_TABLET | Freq: Four times a day (QID) | ORAL | 1 refills | Status: DC | PRN

## 2016-07-08 MED ORDER — FENTANYL CITRATE (PF) 250 MCG/5ML IJ SOLN
INTRAMUSCULAR | Status: AC
  Filled 2016-07-08: qty 5

## 2016-07-08 MED ORDER — ACETAMINOPHEN 10 MG/ML IV SOLN
INTRAVENOUS | Status: DC | PRN
  Administered 2016-07-08: 1000 mg via INTRAVENOUS

## 2016-07-08 MED ORDER — DEXAMETHASONE SODIUM PHOSPHATE 10 MG/ML IJ SOLN
INTRAMUSCULAR | Status: AC
  Filled 2016-07-08: qty 1

## 2016-07-08 MED ORDER — ONDANSETRON HCL 4 MG/2ML IJ SOLN
INTRAMUSCULAR | Status: DC | PRN
  Administered 2016-07-08: 4 mg via INTRAVENOUS

## 2016-07-08 MED ORDER — HEPARIN SODIUM (PORCINE) 5000 UNIT/ML IJ SOLN
INTRAMUSCULAR | Status: AC
Start: 2016-07-08 — End: 2016-07-08
  Administered 2016-07-08: 5000 [IU] via SUBCUTANEOUS
  Filled 2016-07-08: qty 1

## 2016-07-08 SURGICAL SUPPLY — 60 items
ADH SKN CLS APL DERMABOND .7 (GAUZE/BANDAGES/DRESSINGS) ×2
BACTOSHIELD CHG 4% 4OZ (MISCELLANEOUS) ×2
BAG URO DRAIN 2000ML W/SPOUT (MISCELLANEOUS) ×4 IMPLANT
BLADE SURG SZ11 CARB STEEL (BLADE) ×4 IMPLANT
CANISTER SUCT 1200ML W/VALVE (MISCELLANEOUS) ×4 IMPLANT
CATH FOLEY 2WAY  5CC 16FR (CATHETERS) ×2
CATH FOLEY 2WAY 5CC 16FR (CATHETERS) ×2
CATH URTH 16FR FL 2W BLN LF (CATHETERS) ×2 IMPLANT
CHLORAPREP W/TINT 26ML (MISCELLANEOUS) ×8 IMPLANT
DEFOGGER SCOPE WARMER CLEARIFY (MISCELLANEOUS) ×4 IMPLANT
DERMABOND ADVANCED (GAUZE/BANDAGES/DRESSINGS) ×2
DERMABOND ADVANCED .7 DNX12 (GAUZE/BANDAGES/DRESSINGS) ×2 IMPLANT
DRAPE LEGGINS SURG 28X43 STRL (DRAPES) ×4 IMPLANT
DRAPE SHEET LG 3/4 BI-LAMINATE (DRAPES) ×4 IMPLANT
DRAPE UNDER BUTTOCK W/FLU (DRAPES) ×4 IMPLANT
GLOVE BIOGEL PI IND STRL 6.5 (GLOVE) IMPLANT
GLOVE BIOGEL PI INDICATOR 6.5 (GLOVE) ×4
GLOVE INDICATOR 7.0 STRL GRN (GLOVE) ×6 IMPLANT
GLOVE PI ORTHOPRO 6.5 (GLOVE) ×2
GLOVE PI ORTHOPRO STRL 6.5 (GLOVE) ×2 IMPLANT
GLOVE SURG SYN 6.5 ES PF (GLOVE) ×16 IMPLANT
GOWN STRL REUS W/ TWL LRG LVL3 (GOWN DISPOSABLE) ×6 IMPLANT
GOWN STRL REUS W/ TWL XL LVL3 (GOWN DISPOSABLE) ×2 IMPLANT
GOWN STRL REUS W/TWL LRG LVL3 (GOWN DISPOSABLE) ×12
GOWN STRL REUS W/TWL XL LVL3 (GOWN DISPOSABLE) ×4
HANDLE YANKAUER SUCT BULB TIP (MISCELLANEOUS) ×2 IMPLANT
IRRIGATION STRYKERFLOW (MISCELLANEOUS) ×2 IMPLANT
IRRIGATOR STRYKERFLOW (MISCELLANEOUS) ×4
IV LACTATED RINGERS 1000ML (IV SOLUTION) ×4 IMPLANT
KIT PINK PAD W/HEAD ARE REST (MISCELLANEOUS) ×4
KIT PINK PAD W/HEAD ARM REST (MISCELLANEOUS) ×2 IMPLANT
KIT RM TURNOVER CYSTO AR (KITS) ×4 IMPLANT
L-HOOK LAP DISP 36CM (ELECTROSURGICAL) ×4
LABEL OR SOLS (LABEL) IMPLANT
LHOOK LAP DISP 36CM (ELECTROSURGICAL) ×2 IMPLANT
LIGASURE BLUNT 5MM 37CM (INSTRUMENTS) ×4 IMPLANT
MANIPULATOR VCARE LG CRV RETR (MISCELLANEOUS) ×2 IMPLANT
MANIPULATOR VCARE SML CRV RETR (MISCELLANEOUS) ×2 IMPLANT
MANIPULATOR VCARE STD CRV RETR (MISCELLANEOUS) IMPLANT
NS IRRIG 500ML POUR BTL (IV SOLUTION) ×4 IMPLANT
PACK LAP CHOLECYSTECTOMY (MISCELLANEOUS) ×4 IMPLANT
PAD OB MATERNITY 4.3X12.25 (PERSONAL CARE ITEMS) ×4 IMPLANT
PAD PREP 24X41 OB/GYN DISP (PERSONAL CARE ITEMS) ×4 IMPLANT
PENCIL ELECTRO HAND CTR (MISCELLANEOUS) ×4 IMPLANT
SCRUB CHG 4% DYNA-HEX 4OZ (MISCELLANEOUS) ×2 IMPLANT
SET CYSTO W/LG BORE CLAMP LF (SET/KITS/TRAYS/PACK) IMPLANT
SET YANKAUER POOLE SUCT (MISCELLANEOUS) ×4 IMPLANT
SLEEVE ENDOPATH XCEL 5M (ENDOMECHANICALS) ×8 IMPLANT
SPONGE XRAY 4X4 16PLY STRL (MISCELLANEOUS) ×2 IMPLANT
SURGILUBE 2OZ TUBE FLIPTOP (MISCELLANEOUS) ×4 IMPLANT
SUT MNCRL 4-0 (SUTURE) ×4
SUT MNCRL 4-0 27XMFL (SUTURE) ×2
SUT MNCRL AB 4-0 PS2 18 (SUTURE) ×4 IMPLANT
SUT VIC AB 0 CT1 36 (SUTURE) ×8 IMPLANT
SUT VIC AB 4-0 PS2 18 (SUTURE) ×4 IMPLANT
SUTURE MNCRL 4-0 27XMF (SUTURE) ×2 IMPLANT
TIP RIGID 35CM EVICEL (HEMOSTASIS) ×2 IMPLANT
TROCAR XCEL NON-BLD 5MMX100MML (ENDOMECHANICALS) ×4 IMPLANT
TUBING INSUF HEATED (TUBING) ×1 IMPLANT
TUBING INSUFFLATOR HI FLOW (MISCELLANEOUS) ×4 IMPLANT

## 2016-07-08 NOTE — Anesthesia Post-op Follow-up Note (Cosign Needed)
Anesthesia QCDR form completed.        

## 2016-07-08 NOTE — Discharge Instructions (Signed)
Discharge instructions:  °Call office if you have any of the following: fever >101 F, chills, excessive vaginal bleeding, incision drainage or problems, leg pain or redness, or any other concerns.  ° °Activity: Do not lift > 10 lbs for 8 weeks.  °No intercourse or tampons for 8 weeks.  °No driving for 1-2 weeks.  ° °You may feel some pain in your upper right abdomen/rib and right shoulder.  This is from the gas in the abdomen for surgery. This will subside over time, please be patient! ° °Take 600mg Ibuprofen and 1000mg Tylenol around the clock, every 6 hours for at least the first 3-5 days.  After this you can take as needed.  This will help decrease inflammation and promote healing.  The narcotics you'll take just as needed, as they just trick your brain into thinking its not in pain.   ° °Please don't limit yourself in terms of routine activity.  You will be able to do most things, although they may take longer to do or be a little painful.  You can do it! ° °Don't be a hero, but don't be a wimp either!  ° ° °AMBULATORY SURGERY  °DISCHARGE INSTRUCTIONS ° ° °1) The drugs that you were given will stay in your system until tomorrow so for the next 24 hours you should not: ° °A) Drive an automobile °B) Make any legal decisions °C) Drink any alcoholic beverage ° ° °2) You may resume regular meals tomorrow.  Today it is better to start with liquids and gradually work up to solid foods. ° °You may eat anything you prefer, but it is better to start with liquids, then soup and crackers, and gradually work up to solid foods. ° ° °3) Please notify your doctor immediately if you have any unusual bleeding, trouble breathing, redness and pain at the surgery site, drainage, fever, or pain not relieved by medication. ° ° ° °4) Additional Instructions: ° ° ° ° ° ° ° °Please contact your physician with any problems or Same Day Surgery at [phone], Monday through Friday 6 am to 4 pm, or [facility] [place] Main number at  [phone]. ° °

## 2016-07-08 NOTE — Interval H&P Note (Signed)
History and Physical Interval Note:  07/08/2016 11:39 AM  Audrey Bates  has presented today for surgery, with the diagnosis of Heavy Menses  Dysmenorrhea  The various methods of treatment have been discussed with the patient and family. After consideration of risks, benefits and other options for treatment, the patient has consented to  Procedure(s): HYSTERECTOMY TOTAL LAPAROSCOPIC (N/A) LAPAROSCOPIC BILATERAL SALPINGECTOMY (Bilateral) as a surgical intervention .  The patient's history has been reviewed, patient examined, no change in status, stable for surgery.  I have reviewed the patient's chart and labs.  Questions were answered to the patient's satisfaction.     [place]

## 2016-07-08 NOTE — Anesthesia Preprocedure Evaluation (Addendum)
Anesthesia Evaluation  Patient identified by MRN, date of birth, ID band Patient awake    Reviewed: Allergy & Precautions, NPO status , Patient's Chart, lab work & pertinent test results, reviewed documented beta blocker date and time   Airway Mallampati: III  TM Distance: >3 FB     Dental  (+) Chipped   Pulmonary           Cardiovascular hypertension, Pt. on medications      Neuro/Psych    GI/Hepatic   Endo/Other    Renal/GU      Musculoskeletal   Abdominal   Peds  Hematology   Anesthesia Other Findings Obese. EKG ok. No cardiac symptoms.  Reproductive/Obstetrics                            Anesthesia Physical Anesthesia Plan  ASA: III  Anesthesia Plan: General   Post-op Pain Management:    Induction: Intravenous  Airway Management Planned: Oral ETT  Additional Equipment:   Intra-op Plan:   Post-operative Plan:   Informed Consent: I have reviewed the patients History and Physical, chart, labs and discussed the procedure including the risks, benefits and alternatives for the proposed anesthesia with the patient or authorized representative who has indicated his/her understanding and acceptance.     Plan Discussed with: CRNA  Anesthesia Plan Comments:         Anesthesia Quick Evaluation

## 2016-07-08 NOTE — Op Note (Deleted)
Audrey Bates PROCEDURE DATE: 07/08/2016  PREOPERATIVE DIAGNOSIS: Chronic pelvic pain, PATSS POSTOPERATIVE DIAGNOSIS: The same PROCEDURE: Total laparoscopic hysterectomy with vaginal closure, bilateral salpingectomy, cystoscopy, lysis of adhesions SURGEON:  Dr. Benjaman Kindler ASSISTANT: Dr. Vikki Ports Ward Anesthesiologist: Gunnar Bulla, MD Anesthesiologist: Gunnar Bulla, MD CRNA: Jennette Bill  INDICATIONS: 46 y.o.   here for definitive surgical management secondary to the indications listed under preoperative diagnoses; please see preoperative note for further details.  Risks of surgery were discussed with the patient including but not limited to: bleeding which may require transfusion or reoperation; infection which may require antibiotics; injury to bowel, bladder, ureters or other surrounding organs; need for additional procedures; thromboembolic phenomenon, incisional problems and other postoperative/anesthesia complications. Written informed consent was obtained.    FINDINGS:    ANESTHESIA:    General INTRAVENOUS FLUIDS:1200  ml ESTIMATED BLOOD LOSS:minimal URINE OUTPUT: 300 ml   SPECIMENS: Uterus, cervix, bilateral fallopian tubes  COMPLICATIONS: None immediate  PROCEDURE IN DETAIL:  The patient received prophalactic intravenous antibiotics and had sequential compression devices applied to her lower extremities while in the preoperative area.  She was then taken to the operating room where general anesthesia was administered and was found to be adequate.  She was placed in the dorsal lithotomy position, and was prepped and draped in a sterile manner.  A formal time out was performed with all team members present and in agreement.  A V-care uterine manipulator was placed at this time.  A Foley catheter was inserted into her bladder and attached to constant drainage. A Palmer's point entry was planned and executed. Attention was turned to the umbilicus and 8.2% Marcaine infused  subq. A 65mm umbilical incision was made with the scalpel.  The Optiview 5-mm trocar and sleeve were then advanced without difficulty with the laparoscope under direct visualization into the abdomen.  The abdomen was then insufflated with carbon dioxide gas and adequate pneumoperitoneum was obtained.  A survey of the patient's pelvis and abdomen revealed the findings above.  Bilateral lower quadrant ports (5 mm on the right and 5 mm on the left) were then placed under direct visualization.  Using careful monopolar cautery scissors, the filmy adhesions were removed from the anterior abdominal wall. Several large adhesions with bowel in them were left in place. Particularly, these were in the RLQ, lower midline and right lateral to the umbilicus.   The pelvis was then carefully examined.  Attention was turned to the fallopian tubes; these were freed from the underlying mesosalpinx and the uterine attachments using the Ligasure device.  The bilateral round and broad ligaments were then clamped and transected with the Ligasure device.  The uterine artery was then skeletonized and a bladder flap was created.  The ureters were noted to be safely away from the area of dissection.  The bladder was then bluntly dissected off the lower uterine segment.    At this point, attention was turned to the uterine vessels, which were clamped and cauterized using the Ligasure on the left, and then the right. After the uterine blood flow at the level of the internal os was controlled, both arteries were cut with the Ligasure.  Good hemostasis was noted overall.  The uterosacral and cardinal ligaments were clamped, cut and ligated bilaterally .  Attention was then turned to the cervicovaginal junction, and monopolar scissors were used to transect the cervix from the surrounding vagina using the ring of the V-care as a guide. This was done circumferentially allowing total hysterectomy.  The  uterus was then removed from the vagina and  the vaginal cuff incision was then closed with running 0 Vicryl from below.  Overall excellent hemostasis was noted.    Attention was returned to the abdomen.The ureters were reexamined bilaterally and were pulsating normally. The abdominal pressure was reduced and hemostasis was confirmed.   Intravenous floruoceine was administered, and cystoscopy showed bilateral ureteral jets.  No stitches were visualized in the bladder during cystoscopy.  All trocars were removed under direct visualization, and the abdomen was desufflated.  The two lower port fascia were closed with a single stich.   All skin incisions were closed with 4-0 Vicryl subcuticular stitches and Dermabond. The patient tolerated the procedures well.  All instruments, needles, and sponge counts were correct x 2. The patient was taken to the recovery room awake, extubated and in stable condition.

## 2016-07-08 NOTE — Transfer of Care (Signed)
Immediate Anesthesia Transfer of Care Note  Patient: Audrey Bates  Procedure(s) Performed: Procedure(s): HYSTERECTOMY TOTAL LAPAROSCOPIC (N/A) LAPAROSCOPIC BILATERAL SALPINGECTOMY (Bilateral)  Patient Location: PACU  Anesthesia Type:General  Level of Consciousness: awake  Airway & Oxygen Therapy: Patient Spontanous Breathing and Patient connected to face mask oxygen  Post-op Assessment: Report given to RN and Post -op Vital signs reviewed and stable  Post vital signs: Reviewed and stable  Last Vitals:  Vitals:   07/08/16 1050 07/08/16 1615  BP: 122/85 103/64  Pulse: 83 76  Resp: 16 20  Temp: 37.6 C 36.3 C    Last Pain:  Vitals:   07/08/16 1615  TempSrc: Temporal         Complications: No apparent anesthesia complications

## 2016-07-08 NOTE — Op Note (Addendum)
Total Laparoscopic Hysterectomy Operative Note Procedure Date: 07/08/2016  Patient:  Audrey Bates  46 y.o. female  PRE-OPERATIVE DIAGNOSIS:  Heavy Menses  Dysmenorrhea  POST-OPERATIVE DIAGNOSIS:  fibroiid uterus, dysmenorrhea  PROCEDURE:  Procedure(s): HYSTERECTOMY TOTAL LAPAROSCOPIC (N/A) LAPAROSCOPIC BILATERAL SALPINGECTOMY (Bilateral)  SURGEON:  Surgeon(s) and Role:    * Klea Nall, Honor Loh, MD - Primary  PA student assist    ANESTHESIA:  General via ET  I/O  Total I/O In: 1400 [I.V.:1400] Out: 300 [Urine:200; Blood:100]  FINDINGS:  Top-normal sized fibroid uterus, normal ovaries and surgically altered fallopian tubes bilaterally. Omental adhesion to left upper abdomen involving transverse colon. Congested right IP.  IUD strings visible @ cervix.  SPECIMEN: Uterus (with IUD), Cervix, and bilateral fallopian tubes  COMPLICATIONS: none apparent  DISPOSITION: vital signs stable to PACU  Indication for Surgery: 46 y.o. [id number] with known fibroid uterus, s/p myomectomy, with continued heavy bleeding unsatisfactorily responsive to progestin IUD.    Risks of surgery were discussed with the patient including but not limited to: bleeding which may require transfusion or reoperation; infection which may require antibiotics; injury to bowel, bladder, ureters or other surrounding organs; need for additional procedures including laparotomy, blood clot, incisional problems and other postoperative/anesthesia complications. Written informed consent was obtained.      PROCEDURE IN DETAIL:  The patient had 5000u Heparin Sub-q and sequential compression devices applied to her lower extremities while in the preoperative area.  She was then taken to the operating room. IV antibiotics were given. General anesthesia was administered via endotracheal route.  She was placed in the dorsal lithotomy position, and was prepped and draped in a sterile manner. A surgical time-out was performed.  A Foley  catheter was inserted into her bladder and attached to constant drainage and a V-Care uterine manipulator was then advanced into the uterus and a good fit around the cervix was noted. The gloves were changed, and attention was turned to the abdomen where an umbilical incision was made with the scalpel.  A 101mm trochar was inserted in the umbilical incision using a visiport method.Opening pressure was 64mmHg, and the abdomen was insufflated to 15mg Hg carbon dioxide gas and adequate pneumoperitoneum was obtained. A survey of the patient's pelvis and abdomen revealed the findings as mentioned above. Two 86mm ports were inserted in the lower left and right quadrants under visualization.    The bilateral fallopian tubes were separated from the mesosalpinx using the Ligasure. The bilateral round ligaments were transected and anterior broad ligament divided and brought across the uterus to separate the vesicouterine peritoneum and create a bladder flap. The bladder was pushed away from the uterus. The bilateral uterine arteries were skeletonized, ligated and transected. The bilateral uterosacral and cardinal ligaments were ligated and transected. A colpotomy was made around the V-Care cervical cup and the uterus, cervix, and bilateral tubes were removed through the vagina. The vaginal cuff was closed vaginally using 0-Vicryl in a running locking stitch. This was tested for integrity using the surgeon's finger. After a change of gloves, the pneumoperitoneum was recreated and surgical site inspected, and found to be hemostatic. Bilateral ureters were visualized vermiuclating. No intraoperative injury to surrounding organs was noted. The abdomen was desufflated and all instruments were then removed.   All skin incisions were closed with 4-0 monocryl and covered with surgical glue. The patient tolerated the procedures well.  All instruments, needles, and sponge counts were correct x 2. The patient was taken to the  recovery room in stable condition.   ----  Larey Days, MD Attending Obstetrician and Gynecologist [facility]

## 2016-07-08 NOTE — Anesthesia Procedure Notes (Signed)
Procedure Name: Intubation Date/Time: 07/08/2016 2:03 PM Performed by: Jennette Bill Pre-anesthesia Checklist: Patient identified, Patient being monitored, Timeout performed, Emergency Drugs available and Suction available Patient Re-evaluated:Patient Re-evaluated prior to inductionOxygen Delivery Method: Circle system utilized Preoxygenation: Pre-oxygenation with 100% oxygen Intubation Type: IV induction Ventilation: Mask ventilation without difficulty Laryngoscope Size: Mac and 3 Grade View: Grade I Tube type: Oral Tube size: 7.0 mm Number of attempts: 1 Airway Equipment and Method: Stylet Placement Confirmation: ETT inserted through vocal cords under direct vision,  positive ETCO2 and breath sounds checked- equal and bilateral Secured at: 21 cm Tube secured with: Tape Dental Injury: Teeth and Oropharynx as per pre-operative assessment

## 2016-07-09 ENCOUNTER — Encounter: Payer: Self-pay | Admitting: Obstetrics & Gynecology

## 2016-07-10 NOTE — Anesthesia Postprocedure Evaluation (Signed)
Anesthesia Post Note  Patient: Audrey Bates  Procedure(s) Performed: Procedure(s) (LRB): HYSTERECTOMY TOTAL LAPAROSCOPIC (N/A) LAPAROSCOPIC BILATERAL SALPINGECTOMY (Bilateral)  Patient location during evaluation: PACU Anesthesia Type: General Level of consciousness: awake and alert Pain management: pain level controlled Vital Signs Assessment: post-procedure vital signs reviewed and stable Respiratory status: spontaneous breathing, nonlabored ventilation, respiratory function stable and patient connected to nasal cannula oxygen Cardiovascular status: blood pressure returned to baseline and stable Postop Assessment: no signs of nausea or vomiting Anesthetic complications: no     Last Vitals:  Vitals:   07/08/16 1658 07/08/16 1713  BP: 116/84 117/76  Pulse: 77 70  Resp: (!) 21 14  Temp: 36.3 C (!) 35.9 C    Last Pain:  Vitals:   07/09/16 0805  TempSrc:   PainSc: 0-No pain                 Molli Barrows

## 2016-07-11 ENCOUNTER — Other Ambulatory Visit: Payer: BLUE CROSS/BLUE SHIELD

## 2016-07-11 LAB — SURGICAL PATHOLOGY

## 2016-08-02 ENCOUNTER — Emergency Department (HOSPITAL_COMMUNITY): Payer: 59

## 2016-08-02 ENCOUNTER — Encounter: Payer: Self-pay | Admitting: Emergency Medicine

## 2016-08-02 ENCOUNTER — Emergency Department (HOSPITAL_COMMUNITY)
Admission: EM | Admit: 2016-08-02 | Discharge: 2016-08-02 | Disposition: A | Discharge status: Home / Self Care | Payer: 59 | Attending: Emergency Medicine | Admitting: Emergency Medicine

## 2016-08-02 DIAGNOSIS — Y999 Unspecified external cause status: Secondary | ICD-10-CM | POA: Diagnosis not present

## 2016-08-02 DIAGNOSIS — Z91040 Latex allergy status: Secondary | ICD-10-CM | POA: Insufficient documentation

## 2016-08-02 DIAGNOSIS — M542 Cervicalgia: Secondary | ICD-10-CM | POA: Insufficient documentation

## 2016-08-02 DIAGNOSIS — Y939 Activity, unspecified: Secondary | ICD-10-CM | POA: Insufficient documentation

## 2016-08-02 DIAGNOSIS — S63501A Unspecified sprain of right wrist, initial encounter: Secondary | ICD-10-CM | POA: Diagnosis not present

## 2016-08-02 DIAGNOSIS — Y92410 Unspecified street and highway as the place of occurrence of the external cause: Secondary | ICD-10-CM | POA: Insufficient documentation

## 2016-08-02 DIAGNOSIS — T07XXXA Unspecified multiple injuries, initial encounter: Secondary | ICD-10-CM

## 2016-08-02 DIAGNOSIS — S6991XA Unspecified injury of right wrist, hand and finger(s), initial encounter: Secondary | ICD-10-CM | POA: Diagnosis present

## 2016-08-02 MED ORDER — CYCLOBENZAPRINE HCL 5 MG PO TABS: 5.0000 mg | ORAL_TABLET | Freq: Two times a day (BID) | ORAL | 0 refills | Status: DC | PRN

## 2016-08-02 MED ORDER — DICLOFENAC SODIUM 50 MG PO TBEC: 50.0000 mg | DELAYED_RELEASE_TABLET | Freq: Two times a day (BID) | ORAL | 0 refills | Status: DC

## 2016-08-02 MED ORDER — ACETAMINOPHEN 500 MG PO TABS
1000.0000 mg | ORAL_TABLET | Freq: Once | ORAL | Status: AC
  Administered 2016-08-02: 1000 mg via ORAL
  Filled 2016-08-02: qty 2

## 2016-08-02 NOTE — Discharge Instructions (Signed)
Follow up with the neurosurgeon for further evaluation of your neck. Do not drive while taking the muscle relaxant because it will make you sleepy. Return here as needed.   Wear the wrist splint for support and comfort.
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
## 2016-08-07 ENCOUNTER — Encounter: Payer: Self-pay | Admitting: Obstetrics & Gynecology

## 2016-10-18 ENCOUNTER — Encounter: Payer: Self-pay | Admitting: Neurology

## 2016-10-22 ENCOUNTER — Other Ambulatory Visit: Payer: Self-pay | Admitting: *Deleted

## 2016-10-22 DIAGNOSIS — G5603 Carpal tunnel syndrome, bilateral upper limbs: Secondary | ICD-10-CM

## 2016-11-05 ENCOUNTER — Ambulatory Visit (INDEPENDENT_AMBULATORY_CARE_PROVIDER_SITE_OTHER): Payer: 59 | Admitting: Neurology

## 2016-11-05 DIAGNOSIS — G5603 Carpal tunnel syndrome, bilateral upper limbs: Secondary | ICD-10-CM

## 2016-11-05 NOTE — Procedures (Signed)
[facility]  [place], [place]  [place], [place] [address] Tel: [phone] Fax:  [phone] Test Date:  11/05/2016  Patient: Audrey Bates DOB: [birth year] Physician: Narda Amber, DO  Sex: Female Height: 5\' 3"  Ref Phys: Sylvester Harder, DO  ID#: [id number] Temp: 33.8C Technician:    Patient Complaints: This is a 46 year old female referred for evaluation of bilateral wrist pain and hand tingling.  NCV & EMG Findings: Extensive electrodiagnostic testing of the right upper extremity and additional studies of the left shows:  1. Bilateral median, ulnar, and mixed palmar sensory responses are within normal limits. 2. Bilateral median and ulnar motor responses are within normal limits. 3. There is no evidence of active or chronic motor axon loss changes affecting any of the tested muscles. Motor unit configuration and recruitment pattern is within normal limits.  Impression: This is a normal study. In particular, there is no evidence of carpal tunnel syndrome or cervical radiculopathy affecting the upper extremities.   ___________________________ Narda Amber, DO    Nerve Conduction Studies Anti Sensory Summary Table   Site NR Peak (ms) Norm Peak (ms) P-T Amp (V) Norm P-T Amp  Left Median Anti Sensory (2nd Digit)  33.8C  Wrist    2.5 <3.4 73.6 >20  Right Median Anti Sensory (2nd Digit)  33.8C  Wrist    2.7 <3.4 72.3 >20  Left Ulnar Anti Sensory (5th Digit)  33.8C  Wrist    2.5 <3.1 71.6 >12  Right Ulnar Anti Sensory (5th Digit)  33.8C  Wrist    2.5 <3.1 78.4 >12   Motor Summary Table   Site NR Onset (ms) Norm Onset (ms) O-P Amp (mV) Norm O-P Amp Site1 Site2 Delta-0 (ms) Dist (cm) Vel (m/s) Norm Vel (m/s)  Left Median Motor (Abd Poll Brev)  33.8C  Wrist    2.8 <3.9 10.5 >6 Elbow Wrist 4.2 29.0 69 >50  Elbow    7.0  9.9  Axilla Elbow 6.8 0.0    Axilla    13.8  0.0         Right Median Motor (Abd Poll Brev)  33.8C  Wrist    3.0 <3.9 8.8 >6 Elbow  Wrist 4.1 28.0 68 >50  Elbow    7.1  8.6         Left Ulnar Motor (Abd Dig Minimi)  33.8C  Wrist    2.3 <3.1 10.8 >7 B Elbow Wrist 3.5 23.0 66 >50  B Elbow    5.8  10.5  A Elbow B Elbow 1.6 10.0 62 >50  A Elbow    7.4  10.0         Right Ulnar Motor (Abd Dig Minimi)  33.8C  Wrist    2.3 <3.1 10.5 >7 B Elbow Wrist 3.2 23.0 72 >50  B Elbow    5.5  10.3  A Elbow B Elbow 1.6 10.0 63 >50  A Elbow    7.1  10.0          Comparison Summary Table   Site NR Peak (ms) Norm Peak (ms) P-T Amp (V) Site1 Site2 Delta-P (ms) Norm Delta (ms)  Left Median/Ulnar Palm Comparison (Wrist - 8cm)  33.8C  Median Palm    1.4 <2.2 68.4 Median Palm Ulnar Palm 0.1   Ulnar Palm    1.5 <2.2 26.0      Right Median/Ulnar Palm Comparison (Wrist - 8cm)  33.8C  Median Palm    1.3 <2.2 80.7 Median Palm [employer]  0.1   Ulnar Palm    1.4 <2.2 35.2       EMG   Side Muscle Ins Act Fibs Psw Fasc Number Recrt Dur Dur. Amp Amp. Poly Poly. Comment  Right 1stDorInt Nml Nml Nml Nml Nml Nml Nml Nml Nml Nml Nml Nml N/A  Right Ext Indicis Nml Nml Nml Nml Nml Nml Nml Nml Nml Nml Nml Nml N/A  Right PronatorTeres Nml Nml Nml Nml Nml Nml Nml Nml Nml Nml Nml Nml N/A  Right Biceps Nml Nml Nml Nml Nml Nml Nml Nml Nml Nml Nml Nml N/A  Right Triceps Nml Nml Nml Nml Nml Nml Nml Nml Nml Nml Nml Nml N/A  Right Deltoid Nml Nml Nml Nml Nml Nml Nml Nml Nml Nml Nml Nml N/A  Left 1stDorInt Nml Nml Nml Nml Nml Nml Nml Nml Nml Nml Nml Nml N/A  Left Ext Indicis Nml Nml Nml Nml Nml Nml Nml Nml Nml Nml Nml Nml N/A  Left PronatorTeres Nml Nml Nml Nml Nml Nml Nml Nml Nml Nml Nml Nml N/A  Left Biceps Nml Nml Nml Nml Nml Nml Nml Nml Nml Nml Nml Nml N/A  Left Triceps Nml Nml Nml Nml Nml Nml Nml Nml Nml Nml Nml Nml N/A  Left Deltoid Nml Nml Nml Nml Nml Nml Nml Nml Nml Nml Nml Nml N/A      Waveforms:

## 2017-07-20 ENCOUNTER — Other Ambulatory Visit: Payer: Self-pay

## 2017-07-20 ENCOUNTER — Emergency Department (HOSPITAL_COMMUNITY): Payer: Self-pay

## 2017-07-20 ENCOUNTER — Emergency Department (HOSPITAL_COMMUNITY)
Admission: EM | Admit: 2017-07-20 | Discharge: 2017-07-20 | Disposition: A | Discharge status: Home / Self Care | Payer: Self-pay | Attending: Emergency Medicine | Admitting: Emergency Medicine

## 2017-07-20 ENCOUNTER — Encounter (HOSPITAL_COMMUNITY): Payer: Self-pay

## 2017-07-20 DIAGNOSIS — I10 Essential (primary) hypertension: Secondary | ICD-10-CM | POA: Insufficient documentation

## 2017-07-20 DIAGNOSIS — J069 Acute upper respiratory infection, unspecified: Secondary | ICD-10-CM | POA: Insufficient documentation

## 2017-07-20 DIAGNOSIS — Z79899 Other long term (current) drug therapy: Secondary | ICD-10-CM | POA: Insufficient documentation

## 2017-07-20 DIAGNOSIS — B9789 Other viral agents as the cause of diseases classified elsewhere: Secondary | ICD-10-CM | POA: Insufficient documentation

## 2017-07-20 MED ORDER — ALBUTEROL SULFATE HFA 108 (90 BASE) MCG/ACT IN AERS
2.0000 | INHALATION_SPRAY | Freq: Once | RESPIRATORY_TRACT | Status: AC
  Administered 2017-07-20: 2 via RESPIRATORY_TRACT
  Filled 2017-07-20: qty 6.7

## 2017-07-20 MED ORDER — BENZONATATE 100 MG PO CAPS: 100.0000 mg | ORAL_CAPSULE | Freq: Three times a day (TID) | ORAL | 0 refills | Status: DC

## 2017-07-20 MED ORDER — OXYMETAZOLINE HCL 0.05 % NA SOLN
1.0000 | Freq: Once | NASAL | Status: AC
  Administered 2017-07-20: 1 via NASAL
  Filled 2017-07-20: qty 15

## 2017-07-20 MED ORDER — PREDNISONE 20 MG PO TABS: 40.0000 mg | ORAL_TABLET | Freq: Every day | ORAL | 0 refills | Status: DC

## 2017-07-20 NOTE — ED Provider Notes (Signed)
[place] EMERGENCY DEPARTMENT Provider Note   CSN: [id number] Arrival date & time: 07/20/17  1134     History   Chief Complaint Chief Complaint  Patient presents with  . Nasal Congestion    HPI Audrey Bates is a 47 y.o. female.  HPI Audrey Bates is a 47 y.o. female presents to emergency department complaining of headache, nasal congestion, cough, nausea and vomiting.  She states symptoms began 5 days ago.  She states that symptoms are getting worse.  Initially started with scratchy throat and congestion.  Now coughing and having posttussive emesis.  She states she is coughing up clear phlegm.  She reports associated shortness of breath.  No fever or chills.  Has been taking Mucinex and Claritin with no improvement.  No sick contacts.  No other complaints.  Past Medical History:  Diagnosis Date  . Hyperlipemia   . Hypertension   . Uterine fibroid     Patient Active Problem List   Diagnosis Date Noted  . Tinea cruris 11/24/2013    Past Surgical History:  Procedure Laterality Date  . ABDOMINAL HYSTERECTOMY    . BREAST BIOPSY  2013   fibroadenoma  . LAPAROSCOPIC BILATERAL SALPINGECTOMY Bilateral 07/08/2016   Procedure: LAPAROSCOPIC BILATERAL SALPINGECTOMY;  Surgeon: Ward, Honor Loh, MD;  Location: [facility] ORS;  Service: Gynecology;  Laterality: Bilateral;  . LAPAROSCOPIC HYSTERECTOMY N/A 07/08/2016   Procedure: HYSTERECTOMY TOTAL LAPAROSCOPIC;  Surgeon: Ward, Honor Loh, MD;  Location: [facility] ORS;  Service: Gynecology;  Laterality: N/A;  . MYOMECTOMY VAGINAL APPROACH    . TUBAL LIGATION     reanastamosis in 2014  . tubal reversal       OB History    Gravida  3   Para  2   Term  0   Preterm  0   AB  1   Living        SAB  1   TAB  0   Ectopic  0   Multiple      Live Births               Home Medications    Prior to Admission medications   Medication Sig Start Date End Date Taking? Authorizing Provider  Cholecalciferol  (VITAMIN D3) 2000 units CHEW Chew 1 Dose by mouth daily.    [provider]  cyclobenzaprine (FLEXERIL) 5 MG tablet Take 1 tablet (5 mg total) by mouth 3 (three) times daily as needed for muscle spasms. Patient not taking: Reported on 07/02/2016 12/19/15   Frederica Kuster, PA-C  cyclobenzaprine (FLEXERIL) 5 MG tablet Take 1 tablet (5 mg total) by mouth 2 (two) times daily as needed. 08/02/16   Ashley Murrain, NP  diclofenac (VOLTAREN) 50 MG EC tablet Take 1 tablet (50 mg total) by mouth 2 (two) times daily. 08/02/16   Ashley Murrain, NP  ibuprofen (ADVIL,MOTRIN) 600 MG tablet Take 1 tablet (600 mg total) by mouth every 6 (six) hours as needed. 07/08/16   Ward, Honor Loh, MD  lisinopril (PRINIVIL,ZESTRIL) 10 MG tablet Take 10 mg by mouth daily.    [provider]  Omega-3 Fatty Acids (FISH OIL) 1200 MG CAPS Take 1 capsule by mouth daily.    [provider]    Family History Family History  Problem Relation Age of Onset  . Hypertension Mother     Social History Social History   Tobacco Use  . Smoking status: Never Smoker  . Smokeless tobacco: Never Used  Substance Use Topics  . Alcohol use: No  . Drug use: No     Allergies   Latex; Penicillins; and Penicillins   Review of Systems Review of Systems  Constitutional: Negative for chills and fever.  HENT: Positive for congestion and sore throat.   Respiratory: Positive for cough and shortness of breath. Negative for chest tightness.   Cardiovascular: Negative for chest pain, palpitations and leg swelling.  Gastrointestinal: Negative for abdominal pain, diarrhea, nausea and vomiting.  Genitourinary: Negative for dysuria, flank pain, pelvic pain, vaginal bleeding, vaginal discharge and vaginal pain.  Musculoskeletal: Negative for arthralgias, myalgias, neck pain and neck stiffness.  Skin: Negative for rash.  Neurological: Positive for headaches. Negative for dizziness and weakness.  All other systems reviewed  and are negative.    Physical Exam Updated Vital Signs BP 129/89 (BP Location: Right Arm)   Pulse 96   Temp 98.6 F (37 C) (Oral)   Resp 16   Ht 5\' 3"  (1.6 m)   Wt 87.5 kg (193 lb)   LMP 07/02/2016   SpO2 100%   BMI 34.19 kg/m   Physical Exam  Constitutional: She appears well-developed and well-nourished. No distress.  HENT:  Head: Normocephalic.  Intranasal mucosal edema and clear rhinorrhea.  No sinus tenderness.  Oropharynx is normal.  Eyes: Conjunctivae are normal.  Neck: Neck supple.  Cardiovascular: Normal rate, regular rhythm and normal heart sounds.  Pulmonary/Chest: Effort normal. No respiratory distress. She has wheezes. She has no rales.  Slight end expiratory wheezes bilaterally  Abdominal: Soft. Bowel sounds are normal. She exhibits no distension. There is no tenderness. There is no rebound.  Musculoskeletal: She exhibits no edema.  Neurological: She is alert.  Skin: Skin is warm and dry.  Psychiatric: She has a normal mood and affect. Her behavior is normal.  Nursing note and vitals reviewed.    ED Treatments / Results  Labs (all labs ordered are listed, but only abnormal results are displayed) Labs Reviewed - No data to display  EKG None  Radiology No results found.  Procedures Procedures (including critical care time)  Medications Ordered in ED Medications  oxymetazoline (AFRIN) 0.05 % nasal spray 1 spray (has no administration in time range)  albuterol (PROVENTIL HFA;VENTOLIN HFA) 108 (90 Base) MCG/ACT inhaler 2 puff (has no administration in time range)     Initial Impression / Assessment and Plan / ED Course  I have reviewed the triage vital signs and the nursing notes.  Pertinent labs & imaging results that were available during my care of the patient were reviewed by me and considered in my medical decision making (see chart for details).     Patient with congestion, posttussive emesis, slight wheezing on exam.  Will check a chest  x-ray to rule out pneumonia.  Will try Afrin, patient is begging for something to clear of her nasal passages.  She states she is unable to take Sudafed due to her blood pressure issues.  We will also give inhaler for wheezing.  1:50 PM X-ray is normal.  Patient feels much better after the Afrin and inhaler.  Most likely be a viral bronchitis.  Home with inhaler, short course of steroids, and continue decongestants.  Instructed not to use Afrin for more than 3 days.  She will follow-up with family doctor as needed.  Return precautions discussed.  Vital signs all within normal at time of discharge.  Vitals:   07/20/17 1156 07/20/17 1157  BP: 129/89   Pulse: 96  Resp: 16   Temp: 98.6 F (37 C)   TempSrc: Oral   SpO2: 100%   Weight:  87.5 kg (193 lb)  Height:  5\' 3"  (1.6 m)     Final Clinical Impressions(s) / ED Diagnoses   Final diagnoses:  Viral URI with cough    ED Discharge Orders        Ordered    predniSONE (DELTASONE) 20 MG tablet  Daily     07/20/17 1354    benzonatate (TESSALON) 100 MG capsule  Every 8 hours     07/20/17 1354       Jeannett Senior, PA-C 07/20/17 1359    Blanchie Dessert, MD 07/20/17 2123

## 2017-07-20 NOTE — Discharge Instructions (Addendum)
Use inhaler 2 puffs every 4 hours.  Use nasal spray twice a day, do not use for more than 3 days.  Take Tessalon as needed for cough.  Prednisone for wheezing and coughing until all gone.  Follow-up with family doctor or return if not improving.

## 2017-07-20 NOTE — ED Triage Notes (Signed)
Pt states that she has been sick since Wednesday with nasal congestion and vomiting mucus. Denies fever at home.

## 2017-07-20 NOTE — ED Notes (Signed)
Good Rx coupons printed for patient for prescription meds.

## 2017-09-02 ENCOUNTER — Other Ambulatory Visit: Payer: Self-pay | Admitting: Student

## 2017-09-02 DIAGNOSIS — Z1231 Encounter for screening mammogram for malignant neoplasm of breast: Secondary | ICD-10-CM

## 2017-09-02 DIAGNOSIS — E785 Hyperlipidemia, unspecified: Secondary | ICD-10-CM | POA: Insufficient documentation

## 2018-09-09 DIAGNOSIS — F419 Anxiety disorder, unspecified: Secondary | ICD-10-CM | POA: Insufficient documentation

## 2019-05-06 DIAGNOSIS — Z1159 Encounter for screening for other viral diseases: Secondary | ICD-10-CM | POA: Insufficient documentation

## 2019-05-06 DIAGNOSIS — Z1231 Encounter for screening mammogram for malignant neoplasm of breast: Secondary | ICD-10-CM | POA: Insufficient documentation

## 2019-05-06 DIAGNOSIS — Z124 Encounter for screening for malignant neoplasm of cervix: Secondary | ICD-10-CM | POA: Insufficient documentation

## 2019-08-14 DIAGNOSIS — J189 Pneumonia, unspecified organism: Secondary | ICD-10-CM | POA: Insufficient documentation

## 2019-08-15 DIAGNOSIS — D259 Leiomyoma of uterus, unspecified: Secondary | ICD-10-CM | POA: Insufficient documentation

## 2019-08-15 DIAGNOSIS — J679 Hypersensitivity pneumonitis due to unspecified organic dust: Secondary | ICD-10-CM | POA: Insufficient documentation

## 2019-09-24 DIAGNOSIS — J9620 Acute and chronic respiratory failure, unspecified whether with hypoxia or hypercapnia: Secondary | ICD-10-CM | POA: Insufficient documentation

## 2019-09-24 DIAGNOSIS — J8410 Pulmonary fibrosis, unspecified: Secondary | ICD-10-CM | POA: Insufficient documentation

## 2020-01-26 DIAGNOSIS — J8489 Other specified interstitial pulmonary diseases: Secondary | ICD-10-CM | POA: Insufficient documentation

## 2021-01-24 ENCOUNTER — Emergency Department (HOSPITAL_BASED_OUTPATIENT_CLINIC_OR_DEPARTMENT_OTHER): Payer: Managed Care, Other (non HMO)

## 2021-01-24 ENCOUNTER — Emergency Department (HOSPITAL_BASED_OUTPATIENT_CLINIC_OR_DEPARTMENT_OTHER): Payer: Managed Care, Other (non HMO) | Admitting: Radiology

## 2021-01-24 ENCOUNTER — Ambulatory Visit
Admission: EM | Admit: 2021-01-24 | Discharge: 2021-01-24 | Disposition: A | Discharge status: Home / Self Care | Payer: Managed Care, Other (non HMO)

## 2021-01-24 ENCOUNTER — Other Ambulatory Visit: Payer: Self-pay

## 2021-01-24 ENCOUNTER — Emergency Department (HOSPITAL_BASED_OUTPATIENT_CLINIC_OR_DEPARTMENT_OTHER)
Admission: EM | Admit: 2021-01-24 | Discharge: 2021-01-24 | Disposition: A | Discharge status: Home / Self Care | Payer: Managed Care, Other (non HMO) | Attending: Emergency Medicine | Admitting: Emergency Medicine

## 2021-01-24 ENCOUNTER — Encounter: Payer: Self-pay | Admitting: Emergency Medicine

## 2021-01-24 ENCOUNTER — Encounter (HOSPITAL_BASED_OUTPATIENT_CLINIC_OR_DEPARTMENT_OTHER): Payer: Self-pay | Admitting: *Deleted

## 2021-01-24 DIAGNOSIS — M25512 Pain in left shoulder: Secondary | ICD-10-CM | POA: Insufficient documentation

## 2021-01-24 DIAGNOSIS — Z91040 Latex allergy status: Secondary | ICD-10-CM | POA: Diagnosis not present

## 2021-01-24 DIAGNOSIS — R519 Headache, unspecified: Secondary | ICD-10-CM | POA: Diagnosis present

## 2021-01-24 DIAGNOSIS — I10 Essential (primary) hypertension: Secondary | ICD-10-CM | POA: Insufficient documentation

## 2021-01-24 DIAGNOSIS — M549 Dorsalgia, unspecified: Secondary | ICD-10-CM

## 2021-01-24 DIAGNOSIS — Z79899 Other long term (current) drug therapy: Secondary | ICD-10-CM | POA: Insufficient documentation

## 2021-01-24 DIAGNOSIS — M542 Cervicalgia: Secondary | ICD-10-CM

## 2021-01-24 DIAGNOSIS — M546 Pain in thoracic spine: Secondary | ICD-10-CM | POA: Insufficient documentation

## 2021-01-24 MED ORDER — CYCLOBENZAPRINE HCL 10 MG PO TABS: 10.0000 mg | ORAL_TABLET | Freq: Two times a day (BID) | ORAL | 0 refills | Status: DC | PRN
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
## 2021-01-24 NOTE — ED Notes (Signed)
Patient transported to CT 

## 2021-02-27 ENCOUNTER — Other Ambulatory Visit: Payer: Self-pay

## 2021-02-27 ENCOUNTER — Encounter: Payer: Self-pay | Admitting: Emergency Medicine

## 2021-02-27 ENCOUNTER — Ambulatory Visit (INDEPENDENT_AMBULATORY_CARE_PROVIDER_SITE_OTHER): Payer: Managed Care, Other (non HMO)

## 2021-02-27 ENCOUNTER — Ambulatory Visit
Admission: EM | Admit: 2021-02-27 | Discharge: 2021-02-27 | Disposition: A | Discharge status: Home / Self Care | Payer: Managed Care, Other (non HMO) | Attending: Internal Medicine | Admitting: Internal Medicine

## 2021-02-27 DIAGNOSIS — J209 Acute bronchitis, unspecified: Secondary | ICD-10-CM | POA: Diagnosis present

## 2021-02-27 DIAGNOSIS — R059 Cough, unspecified: Secondary | ICD-10-CM

## 2021-02-27 DIAGNOSIS — J029 Acute pharyngitis, unspecified: Secondary | ICD-10-CM | POA: Diagnosis not present

## 2021-02-27 LAB — POCT RAPID STREP A (OFFICE): Rapid Strep A Screen: NEGATIVE

## 2021-02-27 MED ORDER — BENZONATATE 100 MG PO CAPS: 100.0000 mg | ORAL_CAPSULE | Freq: Three times a day (TID) | ORAL | 0 refills | Status: DC | PRN

## 2021-02-27 MED ORDER — ALBUTEROL SULFATE HFA 108 (90 BASE) MCG/ACT IN AERS: 1.0000 | INHALATION_SPRAY | Freq: Four times a day (QID) | RESPIRATORY_TRACT | 0 refills | Status: DC | PRN

## 2021-02-27 MED ORDER — PREDNISONE 10 MG (21) PO TBPK: ORAL_TABLET | Freq: Every day | ORAL | 0 refills | Status: DC

## 2021-02-27 NOTE — ED Provider Notes (Signed)
[facility]    CSN: [id number] Arrival date & time: 02/27/21  0933      History   Chief Complaint Chief Complaint  Patient presents with   Cough    HPI Audrey Bates is a 51 y.o. female.   Patient presents with history of productive cough, nasal congestion, sore throat.  Cough is productive with yellow sputum per patient.  Denies any known fevers or sick contacts.  Denies chest pain, shortness of breath, ear pain, nausea, vomiting, diarrhea, abdominal pain.  Patient does report that she has had a history of interstitial lung disease but does not wear oxygen any longer.  She has been off oxygen for approximately 1 year.  Last saw pulmonology approximately 1 year ago and does not have another appointment set up at this time as she has been discharged.   Cough  Past Medical History:  Diagnosis Date   Hyperlipemia    Hypertension    Uterine fibroid     Patient Active Problem List   Diagnosis Date Noted   Tinea cruris 11/24/2013    Past Surgical History:  Procedure Laterality Date   ABDOMINAL HYSTERECTOMY     BREAST BIOPSY  2013   fibroadenoma   LAPAROSCOPIC BILATERAL SALPINGECTOMY Bilateral 07/08/2016   Procedure: LAPAROSCOPIC BILATERAL SALPINGECTOMY;  Surgeon: Ward, Honor Loh, MD;  Location: [facility] ORS;  Service: Gynecology;  Laterality: Bilateral;   LAPAROSCOPIC HYSTERECTOMY N/A 07/08/2016   Procedure: HYSTERECTOMY TOTAL LAPAROSCOPIC;  Surgeon: Ward, Honor Loh, MD;  Location: [facility] ORS;  Service: Gynecology;  Laterality: N/A;   MYOMECTOMY VAGINAL APPROACH     TUBAL LIGATION     reanastamosis in 2014   tubal reversal      OB History     Gravida  3   Para  2   Term  0   Preterm  0   AB  1   Living         SAB  1   IAB  0   Ectopic  0   Multiple      Live Births               Home Medications    Prior to Admission medications   Medication Sig Start Date End Date Taking? Authorizing Provider  albuterol (VENTOLIN HFA) 108 (90  Base) MCG/ACT inhaler Inhale 1-2 puffs into the lungs every 6 (six) hours as needed for wheezing or shortness of breath. 02/27/21  Yes Keyshon Stein, Hildred Alamin E, FNP  benzonatate (TESSALON) 100 MG capsule Take 1 capsule (100 mg total) by mouth every 8 (eight) hours as needed for cough. 02/27/21  Yes Abagael Kramm, Hildred Alamin E, FNP  predniSONE (STERAPRED UNI-PAK 21 TAB) 10 MG (21) TBPK tablet Take by mouth daily. Take 6 tabs by mouth daily  for 2 days, then 5 tabs for 2 days, then 4 tabs for 2 days, then 3 tabs for 2 days, 2 tabs for 2 days, then 1 tab by mouth daily for 2 days 02/27/21  Yes [place], [place] E, FNP  Cholecalciferol (VITAMIN D3) 2000 units CHEW Chew 1 Dose by mouth daily.    [provider]  cyclobenzaprine (FLEXERIL) 10 MG tablet Take 1 tablet (10 mg total) by mouth 2 (two) times daily as needed for muscle spasms. Patient not taking: Reported on 02/27/2021 01/24/21   Evlyn Courier, PA-C  diclofenac (VOLTAREN) 50 MG EC tablet Take 1 tablet (50 mg total) by mouth 2 (two) times daily. Patient not taking: Reported on 02/27/2021 08/02/16  Debroah Baller M, NP  ibuprofen (ADVIL,MOTRIN) 600 MG tablet Take 1 tablet (600 mg total) by mouth every 6 (six) hours as needed. 07/08/16   Ward, Honor Loh, MD  lisinopril (PRINIVIL,ZESTRIL) 10 MG tablet Take 10 mg by mouth daily.    [provider]  Omega-3 Fatty Acids (FISH OIL) 1200 MG CAPS Take 1 capsule by mouth daily.    [provider]    Family History Family History  Problem Relation Age of Onset   Hypertension Mother     Social History Social History   Tobacco Use   Smoking status: Never   Smokeless tobacco: Never  Vaping Use   Vaping Use: Never used  Substance Use Topics   Alcohol use: No   Drug use: No     Allergies   Latex, Penicillins, and Penicillins   Review of Systems Review of Systems Per HPI  Physical Exam Triage Vital Signs ED Triage Vitals [02/27/21 0950]  Enc Vitals Group     BP 124/84     Pulse Rate 96     Resp 18      Temp 98 F (36.7 C)     Temp Source Oral     SpO2 95 %     Weight      Height      Head Circumference      Peak Flow      Pain Score 7     Pain Loc      Pain Edu?      Excl. in [place]?    No data found.  Updated Vital Signs BP 124/84 (BP Location: Left Arm)    Pulse 96    Temp 98 F (36.7 C) (Oral)    Resp 18    LMP 07/02/2016    SpO2 95%   Visual Acuity Right Eye Distance:   Left Eye Distance:   Bilateral Distance:    Right Eye Near:   Left Eye Near:    Bilateral Near:     Physical Exam Constitutional:      General: She is not in acute distress.    Appearance: Normal appearance. She is not toxic-appearing or diaphoretic.  HENT:     Head: Normocephalic and atraumatic.     Right Ear: Tympanic membrane and ear canal normal.     Left Ear: Tympanic membrane and ear canal normal.     Nose: Congestion present.     Mouth/Throat:     Mouth: Mucous membranes are moist.     Pharynx: Posterior oropharyngeal erythema present. No pharyngeal swelling, oropharyngeal exudate or uvula swelling.     Tonsils: No tonsillar exudate or tonsillar abscesses. 1+ on the right. 1+ on the left.  Eyes:     Extraocular Movements: Extraocular movements intact.     Conjunctiva/sclera: Conjunctivae normal.     Pupils: Pupils are equal, round, and reactive to light.  Cardiovascular:     Rate and Rhythm: Normal rate and regular rhythm.     Pulses: Normal pulses.     Heart sounds: Normal heart sounds.  Pulmonary:     Effort: Pulmonary effort is normal. No respiratory distress.     Breath sounds: Normal breath sounds. No wheezing.  Abdominal:     General: Abdomen is flat. Bowel sounds are normal.     Palpations: Abdomen is soft.  Musculoskeletal:        General: Normal range of motion.     Cervical back: Normal range of motion.  Skin:  General: Skin is warm and dry.  Neurological:     General: No focal deficit present.     Mental Status: She is alert and oriented to person, place, and  time. Mental status is at baseline.  Psychiatric:        Mood and Affect: Mood normal.        Behavior: Behavior normal.     UC Treatments / Results  Labs (all labs ordered are listed, but only abnormal results are displayed) Labs Reviewed  CULTURE, GROUP A STREP [facility])  POCT RAPID STREP A (OFFICE)    EKG   Radiology DG Chest 2 View  Result Date: 02/27/2021 CLINICAL DATA:  Persistent cough and chest congestion EXAM: CHEST - 2 VIEW COMPARISON:  07/20/2017 FINDINGS: Heart size is normal. Mediastinal shadows are normal. There is widespread bronchial thickening suggesting bronchitis. No infiltrate, collapse or effusion. No abnormal bone finding. Mild chronic spinal scoliotic curvature. IMPRESSION: Widespread bronchial thickening suggesting bronchitis. No consolidation or collapse. Electronically Signed   By: Nelson Chimes M.D.   On: 02/27/2021 10:26    Procedures Procedures (including critical care time)  Medications Ordered in UC Medications - No data to display  Initial Impression / Assessment and Plan / UC Course  I have reviewed the triage vital signs and the nursing notes.  Pertinent labs & imaging results that were available during my care of the patient were reviewed by me and considered in my medical decision making (see chart for details).     Chest x-ray showing acute bronchitis.  Rapid strep was negative.  Throat culture is pending.  Will defer antibiotics at this time as patient symptoms appear viral in etiology.  Prednisone steroid taper, albuterol inhaler, benzonatate to take for symptoms.  Discussed strict return precautions.  Patient verbalized understanding and was agreeable with plan. Final Clinical Impressions(s) / UC Diagnoses   Final diagnoses:  Acute bronchitis, unspecified organism  Sore throat     Discharge Instructions      You have bronchitis which is being treated with prednisone steroid, albuterol inhaler, benzonatate cough medication.  Please  follow-up if symptoms persist or worsen.     ED Prescriptions     Medication Sig Dispense Auth. Provider   predniSONE (STERAPRED UNI-PAK 21 TAB) 10 MG (21) TBPK tablet Take by mouth daily. Take 6 tabs by mouth daily  for 2 days, then 5 tabs for 2 days, then 4 tabs for 2 days, then 3 tabs for 2 days, 2 tabs for 2 days, then 1 tab by mouth daily for 2 days 42 tablet [place]ha Raymond, [place] E, [place]   benzonatate (TESSALON) 100 MG capsule Take 1 capsule (100 mg total) by mouth every 8 (eight) hours as needed for cough. 21 capsule [place], [place] E, [place]   albuterol (VENTOLIN HFA) 108 (90 Base) MCG/ACT inhaler Inhale 1-2 puffs into the lungs every 6 (six) hours as needed for wheezing or shortness of breath. 1 each Teodora Medici, [place]      PDMP not reviewed this encounter.   Teodora Medici, [place] 02/27/21 1044

## 2021-02-27 NOTE — ED Triage Notes (Signed)
Pt sts cough and congestion with drainage and some sore throat; pt denies fever; pt sts hx of PNA and interstitial lung disease

## 2021-02-27 NOTE — Discharge Instructions (Addendum)
You have bronchitis which is being treated with prednisone steroid, albuterol inhaler, benzonatate cough medication.  Please follow-up if symptoms persist or worsen.

## 2021-03-02 LAB — CULTURE, GROUP A STREP (THRC)

## 2021-04-10 ENCOUNTER — Encounter: Payer: Self-pay | Admitting: Family

## 2021-04-10 ENCOUNTER — Ambulatory Visit: Payer: Managed Care, Other (non HMO) | Admitting: Family

## 2021-04-10 ENCOUNTER — Other Ambulatory Visit: Payer: Self-pay

## 2021-04-10 VITALS — BP 140/82 | HR 102 | Ht 63.0 in | Wt 203.0 lb

## 2021-04-10 DIAGNOSIS — I10 Essential (primary) hypertension: Secondary | ICD-10-CM | POA: Diagnosis not present

## 2021-04-10 DIAGNOSIS — F411 Generalized anxiety disorder: Secondary | ICD-10-CM | POA: Diagnosis not present

## 2021-04-10 DIAGNOSIS — E782 Mixed hyperlipidemia: Secondary | ICD-10-CM | POA: Diagnosis not present

## 2021-04-10 DIAGNOSIS — R918 Other nonspecific abnormal finding of lung field: Secondary | ICD-10-CM

## 2021-04-10 DIAGNOSIS — E559 Vitamin D deficiency, unspecified: Secondary | ICD-10-CM

## 2021-04-10 DIAGNOSIS — Z6835 Body mass index (BMI) 35.0-35.9, adult: Secondary | ICD-10-CM

## 2021-04-10 DIAGNOSIS — J849 Interstitial pulmonary disease, unspecified: Secondary | ICD-10-CM

## 2021-04-10 MED ORDER — BUSPIRONE HCL 5 MG PO TABS: 5.0000 mg | ORAL_TABLET | Freq: Two times a day (BID) | ORAL | 1 refills | Status: DC

## 2021-04-10 MED ORDER — LOSARTAN POTASSIUM 50 MG PO TABS: 50.0000 mg | ORAL_TABLET | Freq: Every day | ORAL | 1 refills | Status: DC

## 2021-04-10 MED ORDER — CITALOPRAM HYDROBROMIDE 20 MG PO TABS: 20.0000 mg | ORAL_TABLET | Freq: Every day | ORAL | 1 refills | Status: DC

## 2021-04-10 MED ORDER — SAXENDA 18 MG/3ML ~~LOC~~ SOPN: 3.0000 mg | PEN_INJECTOR | Freq: Every day | SUBCUTANEOUS | 2 refills | Status: DC

## 2021-04-10 NOTE — Progress Notes (Signed)
New Patient Office Visit  Subjective:  Patient ID: Audrey Bates, female    DOB: [birth year]  Age: 51 y.o. MRN: [id number]  CC: No chief complaint on file.   HPI Audrey Bates is here to establish care as a new patient.  Prior provider was: Raiford Simmonds, FNP  Pt is without acute concerns.   Mammogram: There are no dominant masses, suspicious asymmetries or calcifications, or unexplained areas of architectural distortion in either breast. Benign-appearing, circumscribed, round and oval masses are present bilaterally.  These masses have fluctuated in size compared to prior studies, compatible with waxing and waning cysts. Stable biopsy marker clip within the upper central right breast.   Pap : last in 2022 December, repeat in five years. Negative per pt. Had gyn with her prior pcp.   chronic concerns:  HTN: doesn't have a blood pressure cuff at home but plans to get 1, currently taking losartan 50 mg once daily.  Denies chest pain or shortness of breath.  Denies lower extremity edema  HLD: borderline, managing with diet currently.   Interstitial lung disease: last saw pulmonary in January 2022, pt overdue for f/u states they never called her back and she didn't call them either. Was seeing Dr. Bobby Rumpf. Ra at rest 98% desat with ambulation to 93%,   CT chest wo contrast 04/21/20. Right lung: A 0.7 x 0.6 cm right middle lobe pulmonary nodule and a 0.3 cm right upper lobe pulmonary nodule (113, 103 series 6) appear more conspicuous when compared to prior exam. These lesions were also appreciated on the exam dated 09/09/2019.   Left lung: No suspicious or dominant nodules.  Background of peripheral groundglass opacity in both lungs involving all 5 lobes, notably in the lung bases with intervening traction bronchiectasis in both lung bases are again noted, marginally progressed when compared to prior exam.  No effusion or pneumothorax in both lungs.  Mild mosaic attenuation seen in both lung  bases on expiratory phase CT, however not dominant feature. Prone images demonstrate persistent fibrosis in the subpleural region of both lower lobes.  GAD: hydroxyxine 25 mg, takes nightly to sleep due to anxiety. Buspar 5 mg taking one po bid, celexa 20 mg once daily. Doing well, denies SI HI helps her manage anxiety. Doing well.   Obesity: started saxenda 18mg /3 ml, currently at 2.6 mg Pt states at home 195 pounds, about two months at home about 207 at home. Denies GI upset tolerating well.  Wt Readings from Last 3 Encounters:  04/10/21 203 lb (92.1 kg)  01/24/21 200 lb (90.7 kg)  07/20/17 193 lb (87.5 kg)     Past Medical History:  Diagnosis Date   Hyperlipemia    Hypertension    Uterine fibroid     Past Surgical History:  Procedure Laterality Date   ABDOMINAL HYSTERECTOMY     BREAST BIOPSY  2013   fibroadenoma   LAPAROSCOPIC BILATERAL SALPINGECTOMY Bilateral 07/08/2016   still with ovaries, Procedure: LAPAROSCOPIC BILATERAL SALPINGECTOMY;  Surgeon: Maceo Pro, MD;  Location: [facility] OR[facility];  Service: Gynecology;  Laterality: Bilateral;   LAPAROSCOPIC HYSTERECTOMY N/A 07/08/2016   Procedure: HYSTERECTOMY TOTAL LAPAROSCOPIC;  Surgeon: Ward, Honor Loh, MD;  Location: [facility] OR[facility];  Service: Gynecology;  Laterality: N/A;   MYOMECTOMY VAGINAL APPROACH     TUBAL LIGATION     reanastamosis in 2014   tubal reversal      Family History  Problem Relation Age of Onset   Hypertension Mother    Lung cancer Father  Social History   Socioeconomic History   Marital status: Divorced    Spouse name: Not on file   Number of children: Not on file   Years of education: Not on file   Highest education level: Not on file  Occupational History   Not on file  Tobacco Use   Smoking status: Never   Smokeless tobacco: Never  Vaping Use   Vaping Use: Never used  Substance and Sexual Activity   Alcohol use: Yes    Comment: socially/occasionally   Drug use: No   Sexual activity: Yes     Partners: Male  Other Topics Concern   Not on file  Social History Narrative   ** Merged History Encounter ** Work or School: full time Education officer, [employer] - [place] Situation: lives by herself, divorced with domestic partner. Has two ddogs.       Spiritual Beliefs: Christian      Lifestyle: walking; diet is good      Social Determinants of Radio broadcast assistant Strain: Not on file  Food Insecurity: Not on file  Transportation Needs: Not on file  Physical Activity: Not on file  Stress: Not on file  Social Connections: Not on file  Intimate Partner Violence: Not on file    Outpatient Medications Prior to Visit  Medication Sig Dispense Refill   albuterol (VENTOLIN HFA) 108 (90 Base) MCG/ACT inhaler Inhale 1-2 puffs into the lungs every 6 (six) hours as needed for wheezing or shortness of breath. 1 each 0   Omega-3 Fatty Acids (FISH OIL) 1200 MG CAPS Take 1 capsule by mouth daily.     benzonatate (TESSALON) 100 MG capsule Take 1 capsule (100 mg total) by mouth every 8 (eight) hours as needed for cough. 21 capsule 0   Liraglutide -Weight Management (SAXENDA) 18 MG/3ML SOPN Inject into the skin daily.     losartan (COZAAR) 50 MG tablet Take 50 mg by mouth daily.     Cholecalciferol (VITAMIN D3) 2000 units CHEW Chew 1 Dose by mouth daily. (Patient not taking: Reported on 04/10/2021)     hydrOXYzine (ATARAX) 25 MG tablet Take 25 mg by mouth at bedtime as needed.     busPIRone (BUSPAR) 5 MG tablet Take 5 mg by mouth 2 (two) times daily.     citalopram (CELEXA) 20 MG tablet Take 20 mg by mouth daily.     cyclobenzaprine (FLEXERIL) 10 MG tablet Take 1 tablet (10 mg total) by mouth 2 (two) times daily as needed for muscle spasms. (Patient not taking: Reported on 02/27/2021) 20 tablet 0   diclofenac (VOLTAREN) 50 MG EC tablet Take 1 tablet (50 mg total) by mouth 2 (two) times daily. (Patient not taking: Reported on 02/27/2021) 15 tablet 0   ibuprofen (ADVIL,MOTRIN) 600 MG  tablet Take 1 tablet (600 mg total) by mouth every 6 (six) hours as needed. 65 tablet 1   lisinopril (PRINIVIL,ZESTRIL) 10 MG tablet Take 10 mg by mouth daily.     predniSONE (STERAPRED UNI-PAK 21 TAB) 10 MG (21) TBPK tablet Take by mouth daily. Take 6 tabs by mouth daily  for 2 days, then 5 tabs for 2 days, then 4 tabs for 2 days, then 3 tabs for 2 days, 2 tabs for 2 days, then 1 tab by mouth daily for 2 days 42 tablet 0   No facility-administered medications prior to visit.    Allergies  Allergen Reactions   Latex  Penicillins Hives and Rash    ROS Review of Systems  Review of Systems  Respiratory:  Negative for shortness of breath.   Cardiovascular:  Negative for chest pain and palpitations.  Gastrointestinal:  Negative for constipation and diarrhea.  Genitourinary:  Negative for dysuria, frequency and urgency.  Musculoskeletal:  Negative for myalgias.  Psychiatric/Behavioral:  Negative for depression and suicidal ideas.   All other systems reviewed and are negative.    Objective:    Physical Exam  Gen: NAD, resting comfortably CV: RRR with no murmurs appreciated Pulm: NWOB, CTAB with no crackles, wheezes, or rhonchi Skin: warm, dry Psych: Normal affect and thought content  BP 140/82    Pulse (!) 102    Ht 5\' 3"  (1.6 m)    Wt 203 lb (92.1 kg)    LMP 07/02/2016    SpO2 94%    BMI 35.96 kg/m  Wt Readings from Last 3 Encounters:  04/10/21 203 lb (92.1 kg)  01/24/21 200 lb (90.7 kg)  07/20/17 193 lb (87.5 kg)     Health Maintenance Due  Topic Date Due   Hepatitis C Screening  Never done   Zoster Vaccines- Shingrix (1 of 2) Never done   COLONOSCOPY (Pts 45-22yrs Insurance coverage will need to be confirmed)  Never done   PAP SMEAR-Modifier  02/19/2016   MAMMOGRAM  06/22/2019   COVID-19 Vaccine (4 - Booster) 07/22/2019   INFLUENZA VACCINE  09/18/2020    There are no preventive care reminders to display for this patient.  No results found for: TSH Lab Results   Component Value Date   WBC 5.5 07/02/2016   HGB 12.9 07/02/2016   HCT 38.5 07/02/2016   MCV 89.3 07/02/2016   PLT 304 07/02/2016   Lab Results  Component Value Date   NA 136 07/02/2016   K 3.8 07/02/2016   CO2 27 07/02/2016   GLUCOSE 90 07/02/2016   BUN 14 07/02/2016   CREATININE 0.55 07/02/2016   CALCIUM 8.6 (L) 07/02/2016   ANIONGAP 5 07/02/2016   No results found for: CHOL No results found for: HDL No results found for: LDLCALC No results found for: TRIG No results found for: CHOLHDL No results found for: HGBA1C    Assessment & Plan:   Problem List Items Addressed This Visit       Cardiovascular and Mediastinum   Primary hypertension    Refill sent for losartan 50 mg once daily.  Patient to obtain blood pressure cuff check once a week goal is less than 140/90.  Patient to let me know if this is not under control.  Advised low-sodium diet.  Exercise as tolerated      Relevant Medications   losartan (COZAAR) 50 MG tablet     Respiratory   Interstitial lung disease ([place])    Reviewed CT of chest on April 21, 2020.  Patient advised to follow-up with pulmonary as she is overdue for her pulm appointment suggested pulmonary fibrosis stable and asymptomatic at current        Other   GAD (generalized anxiety disorder) - Primary    Takes hydroxyzine 25 mg usually takes nightly because she finds that this helps her go to sleep and set off her anxiety.  Stable on BuSpar 5 mg twice daily as well as Celexa 20 mg once daily doing well even though stressors in life handout for anxiety reducing techniques given to patient.  Refill sent for both      Relevant Medications   hydrOXYzine (  ATARAX) 25 MG tablet   citalopram (CELEXA) 20 MG tablet   busPIRone (BUSPAR) 5 MG tablet   Mixed hyperlipidemia    Patient with diet control.  Advised to keep a low-cholesterol diet and exercise as tolerated      Relevant Medications   losartan (COZAAR) 50 MG tablet   Vitamin D deficiency     Continue with vitamin supplementation      Morbid obesity (HCC)    Diet and exercise discussed with patient she is currently on Saxenda 2.  For once daily.  She is titrating next week to the 3 mg refill given for 3 mg Saxenda to take once daily.      Relevant Medications   Liraglutide -Weight Management (SAXENDA) 18 MG/3ML SOPN   Pulmonary nodules    Patient advised to continue follow-up with pulmonary for ongoing evaluation and treat      BMI 35.0-35.9,adult   Relevant Medications   Liraglutide -Weight Management (SAXENDA) 18 MG/3ML SOPN    Meds ordered this encounter  Medications   losartan (COZAAR) 50 MG tablet    Sig: Take 1 tablet (50 mg total) by mouth daily.    Dispense:  90 tablet    Refill:  1    Order Specific Question:   Supervising Provider    Answer:   BEDSOLE, AMY E [[id number]]   citalopram (CELEXA) 20 MG tablet    Sig: Take 1 tablet (20 mg total) by mouth daily.    Dispense:  90 tablet    Refill:  1    Order Specific Question:   Supervising Provider    Answer:   BEDSOLE, AMY E [[id number]]   busPIRone (BUSPAR) 5 MG tablet    Sig: Take 1 tablet (5 mg total) by mouth 2 (two) times daily.    Dispense:  180 tablet    Refill:  1    Order Specific Question:   Supervising Provider    Answer:   BEDSOLE, AMY E [[id number]]   Liraglutide -Weight Management (SAXENDA) 18 MG/3ML SOPN    Sig: Inject 3 mg into the skin daily.    Dispense:  15 mL    Refill:  2    Order Specific Question:   Supervising Provider    Answer:   Diona Browner, AMY E [[id number]]    Follow-up: Return in about 3 months (around 07/08/2021) for regular follow up, f/u on medication.    Eugenia Pancoast, FNP

## 2021-04-10 NOTE — Patient Instructions (Signed)
Start monitoring your blood pressure daily, around the same time of day, for the next 2-3 weeks.  Ensure that you have rested for 30 minutes prior to checking your blood pressure. Record your readings and bring them to your next visit.  Please follow up with your pulmonologist as appointment overdue.   We will get lab work at your three month follow up appt.   It was a pleasure seeing you today! Please do not hesitate to reach out with any questions and or concerns.  Regards,   Eugenia Pancoast FNP-C

## 2021-04-12 DIAGNOSIS — E782 Mixed hyperlipidemia: Secondary | ICD-10-CM | POA: Insufficient documentation

## 2021-04-12 DIAGNOSIS — J849 Interstitial pulmonary disease, unspecified: Secondary | ICD-10-CM | POA: Insufficient documentation

## 2021-04-12 DIAGNOSIS — I10 Essential (primary) hypertension: Secondary | ICD-10-CM | POA: Insufficient documentation

## 2021-04-12 DIAGNOSIS — F411 Generalized anxiety disorder: Secondary | ICD-10-CM | POA: Insufficient documentation

## 2021-04-12 DIAGNOSIS — R918 Other nonspecific abnormal finding of lung field: Secondary | ICD-10-CM | POA: Insufficient documentation

## 2021-04-12 DIAGNOSIS — Z6835 Body mass index (BMI) 35.0-35.9, adult: Secondary | ICD-10-CM | POA: Insufficient documentation

## 2021-04-12 DIAGNOSIS — E559 Vitamin D deficiency, unspecified: Secondary | ICD-10-CM | POA: Insufficient documentation

## 2021-04-12 NOTE — Assessment & Plan Note (Signed)
Patient with diet control.  Advised to keep a low-cholesterol diet and exercise as tolerated

## 2021-04-12 NOTE — Assessment & Plan Note (Signed)
Refill sent for losartan 50 mg once daily.  Patient to obtain blood pressure cuff check once a week goal is less than 140/90.  Patient to let me know if this is not under control.  Advised low-sodium diet.  Exercise as tolerated

## 2021-04-12 NOTE — Assessment & Plan Note (Signed)
Diet and exercise discussed with patient she is currently on Saxenda 2.  For once daily.  She is titrating next week to the 3 mg refill given for 3 mg Saxenda to take once daily.

## 2021-04-12 NOTE — Assessment & Plan Note (Addendum)
Takes hydroxyzine 25 mg usually takes nightly because she finds that this helps her go to sleep and set off her anxiety.  Stable on BuSpar 5 mg twice daily as well as Celexa 20 mg once daily doing well even though stressors in life handout for anxiety reducing techniques given to patient.  Refill sent for both

## 2021-04-12 NOTE — Assessment & Plan Note (Signed)
Patient advised to continue follow-up with pulmonary for ongoing evaluation and treat

## 2021-04-12 NOTE — Assessment & Plan Note (Signed)
Continue with vitamin supplementation

## 2021-04-12 NOTE — Assessment & Plan Note (Addendum)
Reviewed CT of chest on April 21, 2020.  Patient advised to follow-up with pulmonary as she is overdue for her pulm appointment suggested pulmonary fibrosis stable and asymptomatic at current

## 2021-07-03 ENCOUNTER — Ambulatory Visit
Admission: EM | Admit: 2021-07-03 | Discharge: 2021-07-03 | Disposition: A | Discharge status: Home / Self Care | Payer: Managed Care, Other (non HMO) | Attending: Emergency Medicine | Admitting: Emergency Medicine

## 2021-07-03 DIAGNOSIS — Z113 Encounter for screening for infections with a predominantly sexual mode of transmission: Secondary | ICD-10-CM | POA: Diagnosis present

## 2021-07-03 DIAGNOSIS — N898 Other specified noninflammatory disorders of vagina: Secondary | ICD-10-CM | POA: Insufficient documentation

## 2021-07-03 MED ORDER — METRONIDAZOLE 500 MG PO TABS: 500.0000 mg | ORAL_TABLET | Freq: Two times a day (BID) | ORAL | 0 refills | Status: DC

## 2021-07-03 NOTE — ED Triage Notes (Signed)
Patient presents to Urgent Care with complaints of vaginal discharge x 4 days. Has a hx of BV and yeast. Self treated with monstat and femiclear. she states femiclear made her symptoms worse.  ?

## 2021-07-03 NOTE — ED Provider Notes (Signed)
?[facility] ? ? ? ?CSN: [id number] ?Arrival date & time: 07/03/21  1835 ? ? ?  ? ?History   ?Chief Complaint ?Chief Complaint  ?Patient presents with  ? Vaginal Discharge  ?  4 days   ? ? ?HPI ?Audrey Bates is a 51 y.o. female.  Patient presents with greenish vaginal discharge x4 days.  She states this is similar to previous episodes of bacterial vaginosis.  She has attempted treatment with OTC Monistat and Femiclear.  Her symptoms have not improved.  She denies fever, chills, rash, lesions, abdominal pain, dysuria, pelvic pain, or other symptoms. ? ?The history is provided by the patient and medical records.  ? ?Past Medical History:  ?Diagnosis Date  ? Hyperlipemia   ? Hypertension   ? Uterine fibroid   ? ? ?Patient Active Problem List  ? Diagnosis Date Noted  ? GAD (generalized anxiety disorder) 04/12/2021  ? Primary hypertension 04/12/2021  ? Mixed hyperlipidemia 04/12/2021  ? Vitamin D deficiency 04/12/2021  ? Morbid obesity ([place]) 04/12/2021  ? Pulmonary nodules 04/12/2021  ? BMI 35.0-35.9,adult 04/12/2021  ? Interstitial lung disease ([place]) 04/12/2021  ? Tinea cruris 11/24/2013  ? ? ?Past Surgical History:  ?Procedure Laterality Date  ? ABDOMINAL HYSTERECTOMY    ? BREAST BIOPSY  2013  ? fibroadenoma  ? LAPAROSCOPIC BILATERAL SALPINGECTOMY Bilateral 07/08/2016  ? still with ovaries, Procedure: LAPAROSCOPIC BILATERAL SALPINGECTOMY;  Surgeon: Ward, Honor Loh, MD;  Location: [facility] OR[facility];  Service: Gynecology;  Laterality: Bilateral;  ? LAPAROSCOPIC HYSTERECTOMY N/A 07/08/2016  ? Procedure: HYSTERECTOMY TOTAL LAPAROSCOPIC;  Surgeon: Ward, Honor Loh, MD;  Location: [facility] OR[facility];  Service: Gynecology;  Laterality: N/A;  ? MYOMECTOMY VAGINAL APPROACH    ? TUBAL LIGATION    ? reanastamosis in 2014  ? tubal reversal    ? ? ?OB History   ? ? Gravida  ?3  ? Para  ?2  ? Term  ?0  ? Preterm  ?0  ? AB  ?1  ? Living  ?   ?  ? ? SAB  ?1  ? IAB  ?0  ? Ectopic  ?0  ? Multiple  ?   ? Live Births  ?   ?   ?  ?  ? ? ? ?Home  Medications   ? ?Prior to Admission medications   ?Medication Sig Start Date End Date Taking? Authorizing Provider  ?metroNIDAZOLE (FLAGYL) 500 MG tablet Take 1 tablet (500 mg total) by mouth 2 (two) times daily. 07/03/21  Yes Sharion Balloon, NP  ?albuterol (VENTOLIN HFA) 108 (90 Base) MCG/ACT inhaler Inhale 1-2 puffs into the lungs every 6 (six) hours as needed for wheezing or shortness of breath. 02/27/21   Teodora Medici, FNP  ?busPIRone (BUSPAR) 5 MG tablet Take 1 tablet (5 mg total) by mouth 2 (two) times daily. 04/10/21 10/07/21  Eugenia Pancoast, FNP  ?Cholecalciferol (VITAMIN D3) 2000 units CHEW Chew 1 Dose by mouth daily. ?Patient not taking: Reported on 04/10/2021    [provider]  ?citalopram (CELEXA) 20 MG tablet Take 1 tablet (20 mg total) by mouth daily. 04/10/21 10/07/21  Eugenia Pancoast, FNP  ?hydrOXYzine (ATARAX) 25 MG tablet Take 25 mg by mouth at bedtime as needed. 12/09/20   [provider]  ?Liraglutide -Weight Management (SAXENDA) 18 MG/3ML SOPN Inject 3 mg into the skin daily. 04/10/21 07/09/21  Eugenia Pancoast, FNP  ?losartan (COZAAR) 50 MG tablet Take 1 tablet (50 mg total) by mouth daily. 04/10/21 10/07/21  Eugenia Pancoast,  FNP  ?Omega-3 Fatty Acids (FISH OIL) 1200 MG CAPS Take 1 capsule by mouth daily.    [provider]  ? ? ?Family History ?Family History  ?Problem Relation Age of Onset  ? Hypertension Mother   ? Lung cancer Father   ? ? ?Social History ?Social History  ? ?Tobacco Use  ? Smoking status: Never  ? Smokeless tobacco: Never  ?Vaping Use  ? Vaping Use: Never used  ?Substance Use Topics  ? Alcohol use: Yes  ?  Comment: socially/occasionally  ? Drug use: No  ? ? ? ?Allergies   ?Latex and Penicillins ? ? ?Review of Systems ?Review of Systems  ?Constitutional:  Negative for chills and fever.  ?Gastrointestinal:  Negative for abdominal pain.  ?Genitourinary:  Positive for vaginal discharge. Negative for dysuria, flank pain, hematuria and pelvic pain.  ?Skin:  Negative  for color change and rash.  ?All other systems reviewed and are negative. ? ? ?Physical Exam ?Triage Vital Signs ?ED Triage Vitals  ?Enc Vitals Group  ?   BP   ?   Pulse   ?   Resp   ?   Temp   ?   Temp src   ?   SpO2   ?   Weight   ?   Height   ?   Head Circumference   ?   Peak Flow   ?   Pain Score   ?   Pain Loc   ?   Pain Edu?   ?   Excl. in [place]?   ? ?No data found. ? ?Updated Vital Signs ?BP 131/82   Pulse (!) 113   Temp 98.1 ?F (36.7 ?C)   Resp 18   LMP 07/02/2016   SpO2 100%  ? ?Visual Acuity ?Right Eye Distance:   ?Left Eye Distance:   ?Bilateral Distance:   ? ?Right Eye Near:   ?Left Eye Near:    ?Bilateral Near:    ? ?Physical Exam ?Vitals and nursing note reviewed.  ?Constitutional:   ?   General: She is not in acute distress. ?   Appearance: Normal appearance. She is well-developed. She is not ill-appearing.  ?HENT:  ?   Mouth/Throat:  ?   Mouth: Mucous membranes are moist.  ?Cardiovascular:  ?   Rate and Rhythm: Normal rate and regular rhythm.  ?   Heart sounds: Normal heart sounds.  ?Pulmonary:  ?   Effort: Pulmonary effort is normal. No respiratory distress.  ?   Breath sounds: Normal breath sounds.  ?Abdominal:  ?   Palpations: Abdomen is soft.  ?   Tenderness: There is no abdominal tenderness. There is no right CVA tenderness, left CVA tenderness, guarding or rebound.  ?Musculoskeletal:  ?   Cervical back: Neck supple.  ?Skin: ?   General: Skin is warm and dry.  ?Neurological:  ?   Mental Status: She is alert.  ?Psychiatric:     ?   Mood and Affect: Mood normal.     ?   Behavior: Behavior normal.  ? ? ? ?UC Treatments / Results  ?Labs ?(all labs ordered are listed, but only abnormal results are displayed) ?Labs Reviewed  ?CERVICOVAGINAL ANCILLARY ONLY  ? ? ?EKG ? ? ?Radiology ?No results found. ? ?Procedures ?Procedures (including critical care time) ? ?Medications Ordered in UC ?Medications - No data to display ? ?Initial Impression / Assessment and Plan / UC Course  ?I have reviewed the triage  vital signs and the nursing  notes. ? ?Pertinent labs & imaging results that were available during my care of the patient were reviewed by me and considered in my medical decision making (see chart for details). ? ?  ?Vaginal discharge.  Patient obtained self swab for testing.  Treating with metronidazole.  Discussed that we will call if test results are positive.  Discussed that she may require additional treatment at that time.  Instructed patient to abstain from sexual activity for at least 7 days.  Instructed her to follow-up with her PCP or gynecologist if her symptoms are not improving.  Patient agrees to plan of care.  ? ?Final Clinical Impressions(s) / UC Diagnoses  ? ?Final diagnoses:  ?Vaginal discharge  ?Screening for STD (sexually transmitted disease)  ? ? ? ?Discharge Instructions   ? ?  ?Take metronidazole twice a day for 7 days.   ? ?Your vaginal tests are pending.  If your test results are positive, we will call you.  Do not have sexual activity for at least 7 days.   ? ?Follow up with your primary care provider if your symptoms are not improving.   ? ? ? ? ? ?ED Prescriptions   ? ? Medication Sig Dispense Auth. Provider  ? metroNIDAZOLE (FLAGYL) 500 MG tablet Take 1 tablet (500 mg total) by mouth 2 (two) times daily. 14 tablet Sharion Balloon, NP  ? ?  ? ?PDMP not reviewed this encounter. ?  ?Sharion Balloon, NP ?07/03/21 1925 ? ?

## 2021-07-03 NOTE — Discharge Instructions (Addendum)
Take metronidazole twice a day for 7 days.   ? ?Your vaginal tests are pending.  If your test results are positive, we will call you.  Do not have sexual activity for at least 7 days.   ? ?Follow up with your primary care provider if your symptoms are not improving.   ? ?

## 2021-07-04 LAB — CERVICOVAGINAL ANCILLARY ONLY
Bacterial Vaginitis (gardnerella): POSITIVE — AB
Candida Glabrata: NEGATIVE
Candida Vaginitis: NEGATIVE
Chlamydia: NEGATIVE
Comment: NEGATIVE
Comment: NEGATIVE
Comment: NEGATIVE
Comment: NEGATIVE
Comment: NEGATIVE
Comment: NORMAL
Neisseria Gonorrhea: NEGATIVE
Trichomonas: POSITIVE — AB

## 2021-07-09 ENCOUNTER — Ambulatory Visit: Payer: Managed Care, Other (non HMO) | Admitting: Family

## 2021-07-09 ENCOUNTER — Encounter: Payer: Self-pay | Admitting: Family

## 2021-07-09 VITALS — BP 124/74 | HR 94 | Temp 98.3°F | Resp 16 | Ht 63.0 in | Wt 199.4 lb

## 2021-07-09 DIAGNOSIS — I10 Essential (primary) hypertension: Secondary | ICD-10-CM

## 2021-07-09 DIAGNOSIS — R739 Hyperglycemia, unspecified: Secondary | ICD-10-CM | POA: Diagnosis not present

## 2021-07-09 DIAGNOSIS — E785 Hyperlipidemia, unspecified: Secondary | ICD-10-CM | POA: Insufficient documentation

## 2021-07-09 DIAGNOSIS — D649 Anemia, unspecified: Secondary | ICD-10-CM | POA: Diagnosis not present

## 2021-07-09 DIAGNOSIS — E782 Mixed hyperlipidemia: Secondary | ICD-10-CM

## 2021-07-09 DIAGNOSIS — F411 Generalized anxiety disorder: Secondary | ICD-10-CM

## 2021-07-09 LAB — CBC WITH DIFFERENTIAL/PLATELET
Basophils Absolute: 0 10*3/uL (ref 0.0–0.1)
Basophils Relative: 0.5 % (ref 0.0–3.0)
Eosinophils Absolute: 0.3 10*3/uL (ref 0.0–0.7)
Eosinophils Relative: 5.2 % — ABNORMAL HIGH (ref 0.0–5.0)
HCT: 37.4 % (ref 36.0–46.0)
Hemoglobin: 12.6 g/dL (ref 12.0–15.0)
Lymphocytes Relative: 36.4 % (ref 12.0–46.0)
Lymphs Abs: 2.3 10*3/uL (ref 0.7–4.0)
MCHC: 33.8 g/dL (ref 30.0–36.0)
MCV: 89.4 fl (ref 78.0–100.0)
Monocytes Absolute: 0.5 10*3/uL (ref 0.1–1.0)
Monocytes Relative: 8.4 % (ref 3.0–12.0)
Neutro Abs: 3.2 10*3/uL (ref 1.4–7.7)
Neutrophils Relative %: 49.5 % (ref 43.0–77.0)
Platelets: 382 10*3/uL (ref 150.0–400.0)
RBC: 4.18 Mil/uL (ref 3.87–5.11)
RDW: 13.6 % (ref 11.5–15.5)
WBC: 6.4 10*3/uL (ref 4.0–10.5)

## 2021-07-09 LAB — MAGNESIUM: Magnesium: 1.8 mg/dL (ref 1.5–2.5)

## 2021-07-09 LAB — COMPREHENSIVE METABOLIC PANEL
ALT: 27 U/L (ref 0–35)
AST: 20 U/L (ref 0–37)
Albumin: 3.9 g/dL (ref 3.5–5.2)
Alkaline Phosphatase: 44 U/L (ref 39–117)
BUN: 12 mg/dL (ref 6–23)
CO2: 29 mEq/L (ref 19–32)
Calcium: 8.9 mg/dL (ref 8.4–10.5)
Chloride: 102 mEq/L (ref 96–112)
Creatinine, Ser: 0.9 mg/dL (ref 0.40–1.20)
GFR: 73.74 mL/min (ref >60.00)
Glucose, Bld: 91 mg/dL (ref 70–99)
Potassium: 3.6 mEq/L (ref 3.5–5.1)
Sodium: 136 mEq/L (ref 135–145)
Total Bilirubin: 0.4 mg/dL (ref 0.2–1.2)
Total Protein: 7.3 g/dL (ref 6.0–8.3)

## 2021-07-09 LAB — MICROALBUMIN / CREATININE URINE RATIO
Creatinine,U: 524.9 mg/dL
Microalb Creat Ratio: 0.9 mg/g (ref 0.0–30.0)
Microalb, Ur: 4.5 mg/dL — ABNORMAL HIGH (ref 0.0–1.9)

## 2021-07-09 LAB — HEMOGLOBIN A1C: Hgb A1c MFr Bld: 5 % (ref 4.6–6.5)

## 2021-07-09 MED ORDER — CITALOPRAM HYDROBROMIDE 20 MG PO TABS: 20.0000 mg | ORAL_TABLET | Freq: Every day | ORAL | 1 refills | Status: DC

## 2021-07-09 NOTE — Patient Instructions (Signed)
Stop by the lab prior to leaving today. I will notify you of your results once received.   Due to recent changes in healthcare laws, you may see results of your imaging and/or laboratory studies on MyChart before I have had a chance to review them.  I understand that in some cases there may be results that are confusing or concerning to you. Please understand that not all results are received at the same time and often I may need to interpret multiple results in order to provide you with the best plan of care or course of treatment. Therefore, I ask that you please give me 2 business days to thoroughly review all your results before contacting my office for clarification. Should we see a critical lab result, you will be contacted sooner.   It was a pleasure seeing you today! Please do not hesitate to reach out with any questions and or concerns.  Regards,   Winter Trefz FNP-C  

## 2021-07-09 NOTE — Progress Notes (Signed)
Established Patient Office Visit  Subjective:  Patient ID: Audrey Bates, female    DOB: 1970/08/02  Age: 51 y.o. MRN: [id number]  CC:  Chief Complaint  Patient presents with  . Weight Loss    HPI Audrey Bates is here today for follow up.  Pt is without acute concerns.  5/16, recently treated for trichomonas, had a new sexual partner within the last few months. Was also positive for BV. Was given metronidazole. She now is no longer with vaginal discharge and is feeling much better. Her partner is getting treated as well.   Obesity: feels like she maxed out on the saxenda she is on 3 mg. She is not really exercising at this time   HLD: did come fasting today.   HTN: has been ok, last visit was a bit elevated, but ok since. Today 124/74.on losartan 50 mg once daily. Denies CP palp and or sob.   Past Medical History:  Diagnosis Date  . Hyperlipemia   . Hypertension   . Uterine fibroid     Past Surgical History:  Procedure Laterality Date  . ABDOMINAL HYSTERECTOMY    . BREAST BIOPSY  2013   fibroadenoma  . LAPAROSCOPIC BILATERAL SALPINGECTOMY Bilateral 07/08/2016   still with ovaries, Procedure: LAPAROSCOPIC BILATERAL SALPINGECTOMY;  Surgeon: Ward, Honor Loh, MD;  Location: [facility] OR[facility];  Service: Gynecology;  Laterality: Bilateral;  . LAPAROSCOPIC HYSTERECTOMY N/A 07/08/2016   Procedure: HYSTERECTOMY TOTAL LAPAROSCOPIC;  Surgeon: Ward, Honor Loh, MD;  Location: [facility] ORS;  Service: Gynecology;  Laterality: N/A;  . MYOMECTOMY VAGINAL APPROACH    . TUBAL LIGATION     reanastamosis in 2014  . tubal reversal      Family History  Problem Relation Age of Onset  . Hypertension Mother   . Lung cancer Father     Social History   Socioeconomic History  . Marital status: Divorced    Spouse name: Not on file  . Number of children: Not on file  . Years of education: Not on file  . Highest education level: Not on file  Occupational History  . Not on file  Tobacco Use  .  Smoking status: Never  . Smokeless tobacco: Never  Vaping Use  . Vaping Use: Never used  Substance and Sexual Activity  . Alcohol use: Yes    Comment: socially/occasionally  . Drug use: No  . Sexual activity: Yes    Partners: Male  Other Topics Concern  . Not on file  Social History Narrative   ** Merged History Encounter ** Work or School: full time Education officer, [employer] - [place] Situation: lives by herself, divorced with domestic partner. Has two ddogs.       Spiritual Beliefs: Christian      Lifestyle: walking; diet is good      Social Determinants of Radio broadcast assistant Strain: Not on file  Food Insecurity: Not on file  Transportation Needs: Not on file  Physical Activity: Not on file  Stress: Not on file  Social Connections: Not on file  Intimate Partner Violence: Not on file    Outpatient Medications Prior to Visit  Medication Sig Dispense Refill  . albuterol (VENTOLIN HFA) 108 (90 Base) MCG/ACT inhaler Inhale 1-2 puffs into the lungs every 6 (six) hours as needed for wheezing or shortness of breath. 1 each 0  . busPIRone (BUSPAR) 5 MG tablet Take 1 tablet (5 mg total) by mouth 2 (two) times  daily. 180 tablet 1  . Cholecalciferol (VITAMIN D3) 2000 units CHEW Chew 1 Dose by mouth daily.    . citalopram (CELEXA) 20 MG tablet Take 1 tablet (20 mg total) by mouth daily. 90 tablet 1  . hydrOXYzine (ATARAX) 25 MG tablet Take 25 mg by mouth at bedtime as needed.    . Liraglutide -Weight Management (SAXENDA) 18 MG/3ML SOPN Inject 3 mg into the skin daily. 15 mL 2  . losartan (COZAAR) 50 MG tablet Take 1 tablet (50 mg total) by mouth daily. 90 tablet 1  . metroNIDAZOLE (FLAGYL) 500 MG tablet Take 1 tablet (500 mg total) by mouth 2 (two) times daily. 14 tablet 0  . Omega-3 Fatty Acids (FISH OIL) 1200 MG CAPS Take 1 capsule by mouth daily.     No facility-administered medications prior to visit.    Allergies  Allergen Reactions  . Latex   .  Penicillins Hives and Rash    ROS Review of Systems  Constitutional:  Negative for chills and fatigue.  Respiratory:  Negative for cough and shortness of breath.   Cardiovascular:  Negative for chest pain and leg swelling.  Gastrointestinal:  Negative for diarrhea and nausea.  Genitourinary:  Negative for difficulty urinating, menstrual problem and vaginal bleeding.  Neurological:  Negative for dizziness and headaches.  Psychiatric/Behavioral:  Negative for agitation and sleep disturbance.   All other systems reviewed and are negative.     Objective:    Physical Exam  Gen: NAD, resting comfortably HEENT: TMs normal bilaterally. OP clear. No thyromegaly noted.  CV: RRR with no murmurs appreciated Pulm: NWOB, CTAB with no crackles, wheezes, or rhonchi GI: Normal bowel sounds present. Soft, Nontender, Nondistended. MSK: no edema, cyanosis, or clubbing noted Skin: warm, dry Psych: Normal affect and thought content  BP 124/74   Pulse 94   Temp 98.3 F (36.8 C)   Resp 16   Ht '5\' 3"'$  (1.6 m)   Wt 199 lb 6 oz (90.4 kg)   LMP 07/02/2016   SpO2 98%   BMI 35.32 kg/m  Wt Readings from Last 3 Encounters:  07/09/21 199 lb 6 oz (90.4 kg)  04/10/21 203 lb (92.1 kg)  01/24/21 200 lb (90.7 kg)     Health Maintenance Due  Topic Date Due  . Hepatitis C Screening  Never done  . Zoster Vaccines- Shingrix (1 of 2) Never done  . COLONOSCOPY (Pts 45-62yr Insurance coverage will need to be confirmed)  Never done  . PAP SMEAR-Modifier  02/19/2016  . MAMMOGRAM  06/22/2019  . COVID-19 Vaccine (4 - Booster) 07/22/2019    There are no preventive care reminders to display for this patient.  No results found for: TSH Lab Results  Component Value Date   WBC 5.5 07/02/2016   HGB 12.9 07/02/2016   HCT 38.5 07/02/2016   MCV 89.3 07/02/2016   PLT 304 07/02/2016   Lab Results  Component Value Date   NA 136 07/02/2016   K 3.8 07/02/2016   CO2 27 07/02/2016   GLUCOSE 90 07/02/2016    BUN 14 07/02/2016   CREATININE 0.55 07/02/2016   CALCIUM 8.6 (L) 07/02/2016   ANIONGAP 5 07/02/2016   No results found for: CHOL No results found for: HDL No results found for: LDLCALC No results found for: TRIG No results found for: CHOLHDL No results found for: HGBA1C    Assessment & Plan:   Problem List Items Addressed This Visit   None   No orders of the  defined types were placed in this encounter.   Follow-up: No follow-ups on file.    Eugenia Pancoast, FNP

## 2021-07-11 NOTE — Assessment & Plan Note (Signed)
Order a1c  Work on diabetic diet  Work on exercise

## 2021-07-11 NOTE — Assessment & Plan Note (Signed)
Order cbc pending results. 

## 2021-07-11 NOTE — Assessment & Plan Note (Signed)
continue losartan 50 mg once daily Pt advised of the following:  Continue medication as prescribed. Monitor blood pressure periodically and/or when you feel symptomatic. Goal is <130/90 on average. Ensure that you have rested for 30 minutes prior to checking your blood pressure. Record your readings and bring them to your next visit if necessary.work on a low sodium diet.

## 2021-07-11 NOTE — Assessment & Plan Note (Signed)
Fasting today Order lipid panel pending results Work on low chol diet exercise as tolerated

## 2021-07-11 NOTE — Assessment & Plan Note (Signed)
Refill celexa 20 mg once daily Work on anxiety reducing [employer]

## 2021-07-11 NOTE — Assessment & Plan Note (Signed)
Cont saxenda However work on diet and exercise  Consider myfitness pal, calorie counting, and or weight watchers

## 2021-07-17 NOTE — Progress Notes (Signed)
[employer] did not view their my chart test result that I responded to one week ago.  Please call them and advise them of the below results.

## 2021-07-24 ENCOUNTER — Other Ambulatory Visit: Payer: Self-pay | Admitting: Family

## 2021-07-24 DIAGNOSIS — I10 Essential (primary) hypertension: Secondary | ICD-10-CM

## 2021-08-28 ENCOUNTER — Other Ambulatory Visit: Payer: Self-pay | Admitting: Family

## 2021-08-28 DIAGNOSIS — Z6835 Body mass index (BMI) 35.0-35.9, adult: Secondary | ICD-10-CM

## 2021-09-03 ENCOUNTER — Other Ambulatory Visit: Payer: Self-pay | Admitting: Family

## 2021-09-03 DIAGNOSIS — Z6835 Body mass index (BMI) 35.0-35.9, adult: Secondary | ICD-10-CM

## 2021-09-03 MED ORDER — SAXENDA 18 MG/3ML ~~LOC~~ SOPN: 3.0000 mg | PEN_INJECTOR | Freq: Every day | SUBCUTANEOUS | 2 refills | Status: DC

## 2021-10-12 ENCOUNTER — Other Ambulatory Visit: Payer: Self-pay | Admitting: Family

## 2021-10-12 DIAGNOSIS — F411 Generalized anxiety disorder: Secondary | ICD-10-CM

## 2021-10-25 ENCOUNTER — Other Ambulatory Visit (HOSPITAL_COMMUNITY)
Admission: RE | Admit: 2021-10-25 | Discharge: 2021-10-25 | Disposition: A | Discharge status: Home / Self Care | Payer: Managed Care, Other (non HMO) | Source: Ambulatory Visit | Attending: Family | Admitting: Family

## 2021-10-25 ENCOUNTER — Ambulatory Visit: Payer: Managed Care, Other (non HMO) | Admitting: Family

## 2021-10-25 VITALS — BP 128/80 | HR 88 | Temp 97.9°F | Resp 16 | Ht 63.0 in | Wt 198.2 lb

## 2021-10-25 DIAGNOSIS — Z202 Contact with and (suspected) exposure to infections with a predominantly sexual mode of transmission: Secondary | ICD-10-CM | POA: Diagnosis not present

## 2021-10-25 DIAGNOSIS — Z6835 Body mass index (BMI) 35.0-35.9, adult: Secondary | ICD-10-CM

## 2021-10-25 DIAGNOSIS — N898 Other specified noninflammatory disorders of vagina: Secondary | ICD-10-CM | POA: Insufficient documentation

## 2021-10-25 DIAGNOSIS — J849 Interstitial pulmonary disease, unspecified: Secondary | ICD-10-CM | POA: Diagnosis not present

## 2021-10-25 MED ORDER — ALBUTEROL SULFATE HFA 108 (90 BASE) MCG/ACT IN AERS: 1.0000 | INHALATION_SPRAY | Freq: Four times a day (QID) | RESPIRATORY_TRACT | 0 refills | Status: DC | PRN

## 2021-10-25 MED ORDER — SAXENDA 18 MG/3ML ~~LOC~~ SOPN: 3.0000 mg | PEN_INJECTOR | Freq: Every day | SUBCUTANEOUS | 2 refills | Status: DC

## 2021-10-25 NOTE — Progress Notes (Signed)
Established Patient Office Visit  Subjective:  Patient ID: Audrey Bates, female    DOB: May 23, 1970  Age: 51 y.o. MRN: [id number]  CC:  Chief Complaint  Patient presents with   Vaginitis    Discharge yellow-green X 2 days    HPI Audrey Bates is here today with concerns.   Three days ago noticed green and yellow vaginal discharge. Used monistat two days ago with some relief, not as much discharge. Does have some vaginal discharge, no rash on vagina. She is in relationship for five months at this time, and she states at beginning of relationship did have trichomonas from him. She is worried might be Std. She did ask him, and he had nothing to respond with . He doesn't have known STD that he has relayed to.   Not using condoms during sex.   Last pap 2022 negative, per pt.  Gyn Dr. Lilian Kapur at [facility].   Denies any chance of pregnancy, has had hysterectomy.   Past Medical History:  Diagnosis Date   Hyperlipemia    Hypertension    Uterine fibroid     Past Surgical History:  Procedure Laterality Date   ABDOMINAL HYSTERECTOMY     BREAST BIOPSY  2013   fibroadenoma   LAPAROSCOPIC BILATERAL SALPINGECTOMY Bilateral 07/08/2016   still with ovaries, Procedure: LAPAROSCOPIC BILATERAL SALPINGECTOMY;  Surgeon: Ward, Honor Loh, MD;  Location: [facility] ORS;  Service: Gynecology;  Laterality: Bilateral;   LAPAROSCOPIC HYSTERECTOMY N/A 07/08/2016   Procedure: HYSTERECTOMY TOTAL LAPAROSCOPIC;  Surgeon: Ward, Honor Loh, MD;  Location: [facility] OR[facility];  Service: Gynecology;  Laterality: N/A;   MYOMECTOMY VAGINAL APPROACH     TUBAL LIGATION     reanastamosis in 2014   tubal reversal      Family History  Problem Relation Age of Onset   Hypertension Mother    Lung cancer Father     Social History   Socioeconomic History   Marital status: Divorced    Spouse name: Not on file   Number of children: Not on file   Years of education: Not on file   Highest education level:  Not on file  Occupational History   Not on file  Tobacco Use   Smoking status: Never   Smokeless tobacco: Never  Vaping Use   Vaping Use: Never used  Substance and Sexual Activity   Alcohol use: Yes    Comment: socially/occasionally   Drug use: No   Sexual activity: Yes    Partners: Male  Other Topics Concern   Not on file  Social History Narrative   ** Merged History Encounter ** Work or School: full time Education officer, [employer] - [place] Situation: lives by herself, divorced with domestic partner. Has two ddogs.       Spiritual Beliefs: Christian      Lifestyle: walking; diet is good      Social Determinants of Radio broadcast assistant Strain: Not on file  Food Insecurity: Not on file  Transportation Needs: Not on file  Physical Activity: Not on file  Stress: Not on file  Social Connections: Not on file  Intimate Partner Violence: Not on file    Outpatient Medications Prior to Visit  Medication Sig Dispense Refill   busPIRone (BUSPAR) 5 MG tablet TAKE 1 TABLET BY MOUTH TWICE A DAY 180 tablet 1   Cholecalciferol (VITAMIN D3) 2000 units CHEW Chew 1 Dose by mouth daily.  citalopram (CELEXA) 20 MG tablet Take 1 tablet (20 mg total) by mouth daily. 90 tablet 1   hydrOXYzine (ATARAX) 25 MG tablet Take 25 mg by mouth at bedtime as needed.     losartan (COZAAR) 50 MG tablet TAKE 1 TABLET BY MOUTH EVERY DAY 90 tablet 1   albuterol (VENTOLIN HFA) 108 (90 Base) MCG/ACT inhaler Inhale 1-2 puffs into the lungs every 6 (six) hours as needed for wheezing or shortness of breath. 1 each 0   Liraglutide -Weight Management (SAXENDA) 18 MG/3ML SOPN Inject 3 mg into the skin daily. 15 mL 2   metroNIDAZOLE (FLAGYL) 500 MG tablet Take 1 tablet (500 mg total) by mouth 2 (two) times daily. 14 tablet 0   Omega-3 Fatty Acids (FISH OIL) 1200 MG CAPS Take 1 capsule by mouth daily. (Patient not taking: Reported on 10/25/2021)     No facility-administered medications prior to  visit.    Allergies  Allergen Reactions   Latex    Penicillins Hives and Rash        Objective:    Physical Exam Constitutional:      General: She is not in acute distress.    Appearance: Normal appearance. She is obese. She is not ill-appearing, toxic-appearing or diaphoretic.  Abdominal:     Hernia: There is no hernia in the left inguinal area or right inguinal area.  Genitourinary:    Pubic Area: No rash or pubic lice.      Labia:        Right: No rash or tenderness.        Left: No rash or tenderness.      Vagina: Vaginal discharge (white thick vaginal discharge with yellow tinge) and erythema present.     Cervix: Discharge (white yellowish thick vaginal discharge) and erythema present.  Lymphadenopathy:     Lower Body: No right inguinal adenopathy. No left inguinal adenopathy.  Neurological:     Mental Status: She is alert.     BP 128/80   Pulse 88   Temp 97.9 F (36.6 C)   Resp 16   Ht '5\' 3"'$  (1.6 m)   Wt 198 lb 4 oz (89.9 kg)   LMP 07/02/2016   SpO2 98%   BMI 35.12 kg/m  Wt Readings from Last 3 Encounters:  10/25/21 198 lb 4 oz (89.9 kg)  07/09/21 199 lb 6 oz (90.4 kg)  04/10/21 203 lb (92.1 kg)     Health Maintenance Due  Topic Date Due   Hepatitis C Screening  Never done   Zoster Vaccines- Shingrix (1 of 2) Never done   COLONOSCOPY (Pts 45-59yr Insurance coverage will need to be confirmed)  Never done   PAP SMEAR-Modifier  02/19/2016   MAMMOGRAM  06/22/2019   COVID-19 Vaccine (3 - Pfizer risk series) 06/24/2019   INFLUENZA VACCINE  09/18/2021    There are no preventive care reminders to display for this patient.  No results found for: "TSH" Lab Results  Component Value Date   WBC 6.4 07/09/2021   HGB 12.6 07/09/2021   HCT 37.4 07/09/2021   MCV 89.4 07/09/2021   PLT 382.0 07/09/2021   Lab Results  Component Value Date   NA 136 07/09/2021   K 3.6 07/09/2021   CO2 29 07/09/2021   GLUCOSE 91 07/09/2021   BUN 12 07/09/2021    CREATININE 0.90 07/09/2021   BILITOT 0.4 07/09/2021   ALKPHOS 44 07/09/2021   AST 20 07/09/2021   ALT 27 07/09/2021   PROT  7.3 07/09/2021   ALBUMIN 3.9 07/09/2021   CALCIUM 8.9 07/09/2021   ANIONGAP 5 07/02/2016   GFR 73.74 07/09/2021   Lab Results  Component Value Date   HGBA1C 5.0 07/09/2021      Assessment & Plan:   Problem List Items Addressed This Visit       Respiratory   Interstitial lung disease ([place])    Refilled albuterol       Relevant Medications   albuterol (VENTOLIN HFA) 108 (90 Base) MCG/ACT inhaler     Other   Morbid obesity ([place])   Relevant Medications   Liraglutide -Weight Management (SAXENDA) 18 MG/3ML SOPN   Other Relevant Orders   Amb Ref to Medical Weight Management   BMI 35.0-35.9,adult   Relevant Medications   Liraglutide -Weight Management (SAXENDA) 18 MG/3ML SOPN   Other Relevant Orders   Amb Ref to Medical Weight Management   Possible exposure to STD    Suspected exposure due to possible infidelity   Ordering std panel for pt, pending results       Relevant Orders   RPR   HIV Antibody (routine testing w rflx)   Herpes Simplex Virus 1 and 2 (IgG), with Reflex to HSV-2 Inhibition   Cervicovaginal ancillary only   Hepatitis C antibody   Vaginal discharge - Primary    [facility] ordered to test for GC chlamydia BV and yeast  Possible chlamydia vs trich or BV due to discharge Pending to treat as no 'known' actual exposure or agent eposure         Relevant Orders   RPR   HIV Antibody (routine testing w rflx)   Herpes Simplex Virus 1 and 2 (IgG), with Reflex to HSV-2 Inhibition   Cervicovaginal ancillary only   Hepatitis C antibody   Severe obesity (BMI 35.0-35.9 with comorbidity) ([place])    Pt not prediabetic or diabetic, so insurance will not cover other injectable options likely.  At this time, have decided to refer pt to weight loss management clinic. Referral placed Continue saxenda 3 mg QD for now Did encourage diet and exercise  as pt not really focusing on this right now.       Relevant Medications   Liraglutide -Weight Management (SAXENDA) 18 MG/3ML SOPN    Meds ordered this encounter  Medications   albuterol (VENTOLIN HFA) 108 (90 Base) MCG/ACT inhaler    Sig: Inhale 1-2 puffs into the lungs every 6 (six) hours as needed for wheezing or shortness of breath.    Dispense:  1 each    Refill:  0   Liraglutide -Weight Management (SAXENDA) 18 MG/3ML SOPN    Sig: Inject 3 mg into the skin daily.    Dispense:  15 mL    Refill:  2    Order Specific Question:   Supervising Provider    Answer:   Diona Browner, AMY E [[id number]]    Follow-up: Return in about 6 months (around 04/25/2022) for regular follow up appointment .    Eugenia Pancoast, FNP

## 2021-10-25 NOTE — Assessment & Plan Note (Signed)
[facility] ordered to test for GC chlamydia BV and yeast  Possible chlamydia vs trich or BV due to discharge Pending to treat as no 'known' actual exposure or agent eposure

## 2021-10-25 NOTE — Assessment & Plan Note (Signed)
Refilled albuterol 

## 2021-10-25 NOTE — Assessment & Plan Note (Addendum)
Suspected exposure due to possible infidelity   Ordering std panel for pt, pending results

## 2021-10-25 NOTE — Patient Instructions (Signed)
A referral was placed today for weight loss clinic.  Please let us know if you have not heard back within 2 weeks about the referral.  Due to recent changes in healthcare laws, you may see results of your imaging and/or laboratory studies on MyChart before I have had a chance to review them.  I understand that in some cases there may be results that are confusing or concerning to you. Please understand that not all results are received at the same time and often I may need to interpret multiple results in order to provide you with the best plan of care or course of treatment. Therefore, I ask that you please give me 2 business days to thoroughly review all your results before contacting my office for clarification. Should we see a critical lab result, you will be contacted sooner.   It was a pleasure seeing you today! Please do not hesitate to reach out with any questions and or concerns.  Regards,   Eugenia Pancoast FNP-C

## 2021-10-25 NOTE — Assessment & Plan Note (Signed)
Pt not prediabetic or diabetic, so insurance will not cover other injectable options likely.  At this time, have decided to refer pt to weight loss management clinic. Referral placed Continue saxenda 3 mg QD for now Did encourage diet and exercise as pt not really focusing on this right now.

## 2021-10-30 ENCOUNTER — Encounter: Payer: Self-pay | Admitting: Family

## 2021-10-30 LAB — CERVICOVAGINAL ANCILLARY ONLY
Bacterial Vaginitis (gardnerella): NEGATIVE
Candida Glabrata: NEGATIVE
Candida Vaginitis: POSITIVE — AB
Chlamydia: NEGATIVE
Comment: NEGATIVE
Comment: NEGATIVE
Comment: NEGATIVE
Comment: NEGATIVE
Comment: NEGATIVE
Comment: NORMAL
Neisseria Gonorrhea: NEGATIVE
Trichomonas: NEGATIVE

## 2021-10-31 ENCOUNTER — Other Ambulatory Visit: Payer: Self-pay | Admitting: Family

## 2021-10-31 DIAGNOSIS — B3731 Acute candidiasis of vulva and vagina: Secondary | ICD-10-CM

## 2021-10-31 MED ORDER — FLUCONAZOLE 150 MG PO TABS: ORAL_TABLET | ORAL | 0 refills | Status: DC

## 2021-11-03 LAB — HIV ANTIBODY (ROUTINE TESTING W REFLEX): HIV 1&2 Ab, 4th Generation: NONREACTIVE

## 2021-11-03 LAB — HEPATITIS C ANTIBODY: Hepatitis C Ab: NONREACTIVE

## 2021-11-03 LAB — HSV 2 INHIBITION: HSV 2 IGG INHIBITION,IA: POSITIVE — AB

## 2021-11-03 LAB — RPR: RPR Ser Ql: NONREACTIVE

## 2021-11-03 LAB — HERPES SIMPLEX VIRUS 1 AND 2 (IGG),REFLEX HSV-2 INHIBITION
HAV 1 IGG,TYPE SPECIFIC AB: 36 index — ABNORMAL HIGH
HSV 2 IGG,TYPE SPECIFIC AB: 9.66 index — ABNORMAL HIGH

## 2021-11-21 NOTE — Progress Notes (Signed)
[employer] did not view their my chart test result that I responded to one week ago.  Please call them and advise them of the below results.

## 2021-11-30 ENCOUNTER — Other Ambulatory Visit: Payer: Self-pay | Admitting: Family

## 2021-11-30 DIAGNOSIS — J849 Interstitial pulmonary disease, unspecified: Secondary | ICD-10-CM

## 2021-12-17 ENCOUNTER — Encounter (INDEPENDENT_AMBULATORY_CARE_PROVIDER_SITE_OTHER): Payer: Self-pay

## 2022-01-07 ENCOUNTER — Other Ambulatory Visit: Payer: Self-pay | Admitting: Family

## 2022-01-07 DIAGNOSIS — F411 Generalized anxiety disorder: Secondary | ICD-10-CM

## 2022-03-15 ENCOUNTER — Telehealth: Payer: Self-pay | Admitting: Family

## 2022-03-15 NOTE — Telephone Encounter (Signed)
Called patient set up office visit for evaluation of hot flashes informed that we will order mammogram at that time.

## 2022-03-15 NOTE — Telephone Encounter (Addendum)
Patient called and wanted a referral for a mammogram. Also patient wanted to get a prescription for hot flashes. Call back number [phone]

## 2022-03-19 ENCOUNTER — Ambulatory Visit: Payer: Managed Care, Other (non HMO) | Admitting: Family

## 2022-03-19 ENCOUNTER — Encounter: Payer: Self-pay | Admitting: Family

## 2022-03-19 VITALS — BP 116/78 | HR 98 | Temp 98.3°F | Ht 63.0 in | Wt 192.2 lb

## 2022-03-19 DIAGNOSIS — N951 Menopausal and female climacteric states: Secondary | ICD-10-CM

## 2022-03-19 DIAGNOSIS — R590 Localized enlarged lymph nodes: Secondary | ICD-10-CM

## 2022-03-19 DIAGNOSIS — Z1231 Encounter for screening mammogram for malignant neoplasm of breast: Secondary | ICD-10-CM | POA: Diagnosis not present

## 2022-03-19 DIAGNOSIS — J849 Interstitial pulmonary disease, unspecified: Secondary | ICD-10-CM

## 2022-03-19 DIAGNOSIS — Z6834 Body mass index (BMI) 34.0-34.9, adult: Secondary | ICD-10-CM

## 2022-03-19 DIAGNOSIS — R92323 Mammographic fibroglandular density, bilateral breasts: Secondary | ICD-10-CM | POA: Diagnosis not present

## 2022-03-19 DIAGNOSIS — R61 Generalized hyperhidrosis: Secondary | ICD-10-CM

## 2022-03-19 DIAGNOSIS — E661 Drug-induced obesity: Secondary | ICD-10-CM

## 2022-03-19 DIAGNOSIS — F411 Generalized anxiety disorder: Secondary | ICD-10-CM

## 2022-03-19 LAB — CBC
HCT: 36.7 % (ref 36.0–46.0)
Hemoglobin: 12.3 g/dL (ref 12.0–15.0)
MCHC: 33.4 g/dL (ref 30.0–36.0)
MCV: 88.4 fl (ref 78.0–100.0)
Platelets: 379 10*3/uL (ref 150.0–400.0)
RBC: 4.16 Mil/uL (ref 3.87–5.11)
RDW: 13.3 % (ref 11.5–15.5)
WBC: 6.7 10*3/uL (ref 4.0–10.5)

## 2022-03-19 LAB — TSH: TSH: 1.48 u[IU]/mL (ref 0.35–5.50)

## 2022-03-19 LAB — FOLLICLE STIMULATING HORMONE: FSH: 25.1 m[IU]/mL

## 2022-03-19 NOTE — Assessment & Plan Note (Signed)
Noncompliant with f/u six months ago  Ordering l breast u/s and screening mammogram to monitor stability R/o other causes of hot flashes .

## 2022-03-19 NOTE — Assessment & Plan Note (Signed)
Labs ordered pending results

## 2022-03-19 NOTE — Assessment & Plan Note (Signed)
Stable Continue celexa 20 mg  Can consider increase if lab work up negative clinical reason for hot flashes, may help

## 2022-03-19 NOTE — Progress Notes (Signed)
Established Patient Office Visit  Subjective:   Patient ID: Audrey Bates, female    DOB: 08/20/1970  Age: 52 y.o. MRN: [id number]  CC:  Chief Complaint  Patient presents with   Hot Flashes    Also needs Mammo referral.    HPI: Audrey Bates is a 52 y.o. female presenting on 03/19/2022 for Hot Flashes (Also needs Mammo referral.)   HPI  Sick about two weeks ago with covid and or flu like symptoms, and since then has had increased hot flashes on and off. She feels it often throughout the day. She didn't take antbx and or anything other. She states symptoms for the URI have resolved.   Doesn't note more sob more than usual.  No cough.   She does note some excessive sweating, especially under her breasts.  No vaginal discharge   With hysterectomy but still with ovaries.   Left breast axillary lymph node, 1/23 pt had mammogram and biopsy, was supposed to repeat l breast u/s in six months but pt declined. Overdue.     ROS: Negative unless specifically indicated above in HPI.   Relevant past medical history reviewed and updated as indicated.   Allergies and medications reviewed and updated.   Current Outpatient Medications:    albuterol (VENTOLIN HFA) 108 (90 Base) MCG/ACT inhaler, INHALE 1-2 PUFFS BY MOUTH EVERY 6 HOURS AS NEEDED FOR WHEEZE OR SHORTNESS OF BREATH, Disp: 1 each, Rfl: 2   busPIRone (BUSPAR) 5 MG tablet, TAKE 1 TABLET BY MOUTH TWICE A DAY, Disp: 180 tablet, Rfl: 1   Cholecalciferol (VITAMIN D3) 2000 units CHEW, Chew 1 Dose by mouth daily., Disp: , Rfl:    citalopram (CELEXA) 20 MG tablet, TAKE 1 TABLET BY MOUTH EVERY DAY, Disp: 90 tablet, Rfl: 1   ibuprofen (ADVIL) 600 MG tablet, Take 1 tablet by mouth every 6 (six) hours as needed., Disp: , Rfl:    losartan (COZAAR) 50 MG tablet, TAKE 1 TABLET BY MOUTH EVERY DAY, Disp: 90 tablet, Rfl: 1   aspirin-acetaminophen-caffeine (EXCEDRIN MIGRAINE) 250-250-65 MG tablet, Take 1 tablet by mouth every 6 (six) hours as  needed., Disp: , Rfl:   Allergies  Allergen Reactions   Latex    Penicillins Hives and Rash    Objective:   BP 116/78   Pulse 98   Temp 98.3 F (36.8 C) (Oral)   Ht '5\' 3"'$  (1.6 m)   Wt 192 lb 3.2 oz (87.2 kg)   LMP 07/02/2016   BMI 34.05 kg/m    Physical Exam Constitutional:      General: She is not in acute distress.    Appearance: Normal appearance. She is obese. She is not ill-appearing, toxic-appearing or diaphoretic.  HENT:     Head: Normocephalic.  Cardiovascular:     Rate and Rhythm: Normal rate and regular rhythm.  Pulmonary:     Effort: Pulmonary effort is normal.     Breath sounds: Normal breath sounds.  Musculoskeletal:        General: Normal range of motion.  Neurological:     General: No focal deficit present.     Mental Status: She is alert and oriented to person, place, and time. Mental status is at baseline.  Psychiatric:        Mood and Affect: Mood normal.        Behavior: Behavior normal.        Thought Content: Thought content normal.        Judgment: Judgment normal.  Assessment & Plan:  Excessive sweating -     Follicle stimulating hormone -     Prolactin -     TSH -     CBC  Hot flashes due to menopause Assessment & Plan: Labs ordered pending results  Orders: -     Follicle stimulating hormone -     Prolactin -     TSH -     CBC -     US BREAST COMPLETE UNI LEFT INC AXILLA; Future  Mammographic fibroglandular density, bilateral breasts -     3D Screening Mammogram, Left and Right; Future -     US BREAST COMPLETE UNI LEFT INC AXILLA; Future  Screening mammogram for breast cancer -     3D Screening Mammogram, Left and Right; Future  Class 1 drug-induced obesity without serious comorbidity with body mass index (BMI) of 34.0 to 34.9 in adult  Axillary lymphadenopathy Assessment & Plan: Noncompliant with f/u six months ago  Ordering l breast u/s and screening mammogram to monitor stability R/o other causes of hot flashes .    Orders: -     US BREAST COMPLETE UNI LEFT INC AXILLA; Future  Interstitial lung disease ([place]) Assessment & Plan: stable   GAD (generalized anxiety disorder) Assessment & Plan: Stable Continue celexa 20 mg  Can consider increase if lab work up negative clinical reason for hot flashes, may help       Follow up plan: Return in about 6 months (around 09/17/2022).  Eugenia Pancoast, FNP

## 2022-03-19 NOTE — Patient Instructions (Signed)
  I have sent an electronic order over to your preferred location for the following:   '[]'$   2D Mammogram  '[x]'$   3D Mammogram  and bilateral breast ultrasound '[]'$   Bone Density   Please give this center a call to get scheduled at your convenience.  '[x]'$   [facility]  Make sure to wear two piece  clothing  No lotions powders or deodorants the day of the appointment Make sure to bring picture ID and insurance card.  Bring list of medications you are currently taking including any supplements.    ------------------------------------. Stop by the lab prior to leaving today. I will notify you of your results once received.    Regards,   Eugenia Pancoast FNP-C

## 2022-03-19 NOTE — Assessment & Plan Note (Signed)
stable °

## 2022-03-20 LAB — PROLACTIN: Prolactin: 13.4 ng/mL

## 2022-03-21 ENCOUNTER — Encounter: Payer: Self-pay | Admitting: *Deleted

## 2022-03-21 DIAGNOSIS — F411 Generalized anxiety disorder: Secondary | ICD-10-CM

## 2022-04-08 ENCOUNTER — Other Ambulatory Visit: Payer: Self-pay | Admitting: Family

## 2022-04-08 DIAGNOSIS — I10 Essential (primary) hypertension: Secondary | ICD-10-CM

## 2022-04-09 ENCOUNTER — Other Ambulatory Visit: Payer: Self-pay | Admitting: Family

## 2022-04-09 DIAGNOSIS — R590 Localized enlarged lymph nodes: Secondary | ICD-10-CM

## 2022-04-09 DIAGNOSIS — N951 Menopausal and female climacteric states: Secondary | ICD-10-CM

## 2022-04-09 DIAGNOSIS — R92323 Mammographic fibroglandular density, bilateral breasts: Secondary | ICD-10-CM

## 2022-04-12 ENCOUNTER — Other Ambulatory Visit: Payer: Self-pay | Admitting: Family

## 2022-04-12 ENCOUNTER — Ambulatory Visit
Admission: RE | Admit: 2022-04-12 | Discharge: 2022-04-12 | Disposition: A | Discharge status: Home / Self Care | Payer: Managed Care, Other (non HMO) | Source: Ambulatory Visit | Attending: Family

## 2022-04-12 ENCOUNTER — Ambulatory Visit
Admission: RE | Admit: 2022-04-12 | Discharge: 2022-04-12 | Disposition: A | Discharge status: Home / Self Care | Payer: Managed Care, Other (non HMO) | Source: Ambulatory Visit | Attending: Family | Admitting: Family

## 2022-04-12 DIAGNOSIS — N951 Menopausal and female climacteric states: Secondary | ICD-10-CM

## 2022-04-12 DIAGNOSIS — R92323 Mammographic fibroglandular density, bilateral breasts: Secondary | ICD-10-CM

## 2022-04-12 DIAGNOSIS — R590 Localized enlarged lymph nodes: Secondary | ICD-10-CM | POA: Diagnosis present

## 2022-04-12 DIAGNOSIS — F411 Generalized anxiety disorder: Secondary | ICD-10-CM

## 2022-04-12 DIAGNOSIS — J849 Interstitial pulmonary disease, unspecified: Secondary | ICD-10-CM

## 2022-04-12 DIAGNOSIS — Z1231 Encounter for screening mammogram for malignant neoplasm of breast: Secondary | ICD-10-CM

## 2022-04-12 DIAGNOSIS — R61 Generalized hyperhidrosis: Secondary | ICD-10-CM

## 2022-04-12 DIAGNOSIS — E661 Drug-induced obesity: Secondary | ICD-10-CM

## 2022-04-16 MED ORDER — CITALOPRAM HYDROBROMIDE 40 MG PO TABS: 40.0000 mg | ORAL_TABLET | Freq: Every day | ORAL | 3 refills | Status: DC

## 2022-05-04 ENCOUNTER — Ambulatory Visit
Admission: EM | Admit: 2022-05-04 | Discharge: 2022-05-04 | Disposition: A | Discharge status: Home / Self Care | Payer: Managed Care, Other (non HMO)

## 2022-05-04 DIAGNOSIS — L03113 Cellulitis of right upper limb: Secondary | ICD-10-CM | POA: Diagnosis not present

## 2022-05-04 DIAGNOSIS — Z91038 Other insect allergy status: Secondary | ICD-10-CM | POA: Diagnosis not present

## 2022-05-04 MED ORDER — CEPHALEXIN 500 MG PO CAPS: 500.0000 mg | ORAL_CAPSULE | Freq: Four times a day (QID) | ORAL | 0 refills | Status: AC

## 2022-05-04 MED ORDER — DEXAMETHASONE SODIUM PHOSPHATE 10 MG/ML IJ SOLN
10.0000 mg | Freq: Once | INTRAMUSCULAR | Status: AC
  Administered 2022-05-04: 10 mg via INTRAMUSCULAR

## 2022-05-04 MED ORDER — PREDNISONE 50 MG PO TABS: 50.0000 mg | ORAL_TABLET | Freq: Every day | ORAL | 0 refills | Status: DC

## 2022-05-04 NOTE — Discharge Instructions (Signed)
You have an allergic reaction around the site of the bite, however it also looks as though you are developing a cellulitis. You received a steroid shot in our office. Do not start the prednisone until tomorrow, 05/05/22. Take this once daily with breakfast. Take the cephalexin four times daily, roughly every 6 hours. If any new or worsening symptoms, return for recheck.

## 2022-05-04 NOTE — ED Triage Notes (Signed)
Patient presents to [facility] for possible mosquito insect bite. Pt states she woke up yesterday morning to swelling and redness to right elbow area. States she applied hydrocortisone cream with no changes.

## 2022-05-04 NOTE — ED Provider Notes (Signed)
[name]    CSN: [id number] Arrival date & time: 05/04/22  [id number]      History   Chief Complaint Chief Complaint  Patient presents with   Insect Bite    HPI Analyse Audrey Bates is a 52 y.o. female.   Pleasant 52yo female presents today due to concern of a painful, swollen, warm, itchy area on her R elbow extending into her upper and forearm. States she was using the bathroom yesterday morning around 4:30am. Feels like she was bitten by something, possibly a mosquito, but did not see the offending agent. Went back to bed and woke up with a small itchy area. Used OTC hydrocortisone yesterday without improvement to symptoms, and woke up this morning with the area spreading to her mid-forearm and part way up her upper arm. States it is painful, red and hot. Also continued itching. She denies systemic symptoms; denies throat swelling, dysphagia, cough, wheezing, SOB. Pt reports allergy to PCN only, and can tolerate amoxicillin.     Past Medical History:  Diagnosis Date   Hyperlipemia    Hypertension    Uterine fibroid     Patient Active Problem List   Diagnosis Date Noted   Class 1 drug-induced obesity without serious comorbidity with body mass index (BMI) of 34.0 to 34.9 in adult 03/19/2022   Axillary lymphadenopathy 03/19/2022   Hot flashes due to menopause 03/19/2022   Anemia 07/09/2021   GAD (generalized anxiety disorder) 04/12/2021   Primary hypertension 04/12/2021   Mixed hyperlipidemia 04/12/2021   Vitamin D deficiency 04/12/2021   Pulmonary nodules 04/12/2021   Interstitial lung disease ([place]) 04/12/2021   NSIP (nonspecific interstitial pneumonia) ([place]) 01/26/2020   Acute on chronic respiratory failure ([place]) 09/24/2019   Fibrotic lung diseases ([place]) 09/24/2019   Uterine leiomyoma 08/15/2019   Pneumonitis, hypersensitivity ([place]) 08/15/2019   Community acquired pneumonia, bilateral 08/14/2019   Cervical cancer screening 05/06/2019   Encounter for hepatitis C  screening test for low risk patient 05/06/2019   Encounter for screening mammogram for malignant neoplasm of breast 05/06/2019   Anxiety 09/09/2018   Dyslipidemia 09/02/2017   IUD (intrauterine device) in place 03/27/2015   Uterine polyp 11/22/2014    Past Surgical History:  Procedure Laterality Date   ABDOMINAL HYSTERECTOMY     BREAST BIOPSY  2013   fibroadenoma   LAPAROSCOPIC BILATERAL SALPINGECTOMY Bilateral 07/08/2016   still with ovaries, Procedure: LAPAROSCOPIC BILATERAL SALPINGECTOMY;  Surgeon: Maceo Pro, MD;  Location: [facility] ORS;  Service: Gynecology;  Laterality: Bilateral;   LAPAROSCOPIC HYSTERECTOMY N/A 07/08/2016   Procedure: HYSTERECTOMY TOTAL LAPAROSCOPIC;  Surgeon: Ward, Honor Loh, MD;  Location: [facility] ORS;  Service: Gynecology;  Laterality: N/A;   MYOMECTOMY VAGINAL APPROACH     TUBAL LIGATION     reanastamosis in 2014   tubal reversal      OB History     Gravida  3   Para  2   Term  0   Preterm  0   AB  1   Living         SAB  1   IAB  0   Ectopic  0   Multiple      Live Births               Home Medications    Prior to Admission medications   Medication Sig Start Date End Date Taking? Authorizing Provider  cephALEXin (KEFLEX) 500 MG capsule Take 1 capsule (500 mg total) by mouth 4 (four) times daily  for 5 days. 05/04/22 05/09/22 Yes Baila Rouse L, PA  Liraglutide -Weight Management (SAXENDA) 18 MG/3ML SOPN Inject into the skin. 04/10/21  Yes [provider]  predniSONE (DELTASONE) 50 MG tablet Take 1 tablet (50 mg total) by mouth daily with breakfast. 05/04/22  Yes Tya Haughey L, PA  albuterol (VENTOLIN HFA) 108 (90 Base) MCG/ACT inhaler INHALE 1-2 PUFFS BY MOUTH EVERY 6 HOURS AS NEEDED FOR WHEEZE OR SHORTNESS OF BREATH 12/03/21   Eugenia Pancoast, FNP  aspirin-acetaminophen-caffeine (EXCEDRIN MIGRAINE) [phone] MG tablet Take 1 tablet by mouth every 6 (six) hours as needed.    [provider]  busPIRone  (BUSPAR) 5 MG tablet TAKE 1 TABLET BY MOUTH TWICE A DAY 10/15/21   Eugenia Pancoast, FNP  Cholecalciferol (VITAMIN D3) 2000 units CHEW Chew 1 Dose by mouth daily.    [provider]  citalopram (CELEXA) 40 MG tablet Take 1 tablet (40 mg total) by mouth daily. 04/16/22   Eugenia Pancoast, FNP  ibuprofen (ADVIL) 600 MG tablet Take 1 tablet by mouth every 6 (six) hours as needed. 08/19/16   [provider]  losartan (COZAAR) 50 MG tablet TAKE 1 TABLET BY MOUTH EVERY DAY 04/08/22   Eugenia Pancoast, FNP    Family History Family History  Problem Relation Age of Onset   Hypertension Mother    Lung cancer Father    Breast cancer Maternal Aunt    Breast cancer Paternal Aunt     Social History Social History   Tobacco Use   Smoking status: Never   Smokeless tobacco: Never  Vaping Use   Vaping Use: Never used  Substance Use Topics   Alcohol use: Yes    Comment: socially/occasionally   Drug use: No     Allergies   Latex and Penicillins   Review of Systems Review of Systems As per HPI  Physical Exam Triage Vital Signs ED Triage Vitals  Enc Vitals Group     BP 05/04/22 0822 119/79     Pulse Rate 05/04/22 0822 77     Resp 05/04/22 0822 16     Temp 05/04/22 0822 98.3 F (36.8 C)     Temp Source 05/04/22 0822 Oral     SpO2 05/04/22 0822 97 %     Weight --      Height --      Head Circumference --      Peak Flow --      Pain Score 05/04/22 0824 0     Pain Loc --      Pain Edu? --      Excl. in [place]? --    No data found.  Updated Vital Signs BP 119/79 (BP Location: Left Arm)   Pulse 77   Temp 98.3 F (36.8 C) (Oral)   Resp 16   LMP 07/02/2016   SpO2 97%   Visual Acuity Right Eye Distance:   Left Eye Distance:   Bilateral Distance:    Right Eye Near:   Left Eye Near:    Bilateral Near:     Physical Exam Vitals and nursing note reviewed.  Constitutional:      General: She is not in acute distress.    Appearance: Normal appearance. She is not  ill-appearing, toxic-appearing or diaphoretic.  HENT:     Head: Normocephalic.     Mouth/Throat:     Mouth: Mucous membranes are moist.     Pharynx: Oropharynx is clear. No oropharyngeal exudate.  Eyes:     General: No  scleral icterus.       Right eye: No discharge.        Left eye: No discharge.     Extraocular Movements: Extraocular movements intact.     Conjunctiva/sclera: Conjunctivae normal.     Pupils: Pupils are equal, round, and reactive to light.  Cardiovascular:     Rate and Rhythm: Normal rate.  Pulmonary:     Effort: Pulmonary effort is normal. No respiratory distress.  Musculoskeletal:        General: Normal range of motion.  Skin:    General: Skin is warm and dry.     Capillary Refill: Capillary refill takes less than 2 seconds.     Findings: Erythema (large area, possibly a wheal, extending from mid-dorsal forearm to mid-dorsal upper arm on R. Central bite mark noted around olecranon. Erythematous, warm and raised. FROM elbow) present. No bruising or lesion.  Neurological:     General: No focal deficit present.     Mental Status: She is alert and oriented to person, place, and time.     Sensory: No sensory deficit.     Motor: No weakness.      UC Treatments / Results  Labs (all labs ordered are listed, but only abnormal results are displayed) Labs Reviewed - No data to display  EKG   Radiology No results found.  Procedures Procedures (including critical care time)  Medications Ordered in UC Medications  dexamethasone (DECADRON) injection 10 mg (10 mg Intramuscular Given 05/04/22 0850)    Initial Impression / Assessment and Plan / UC Course  I have reviewed the triage vital signs and the nursing notes.  Pertinent labs & imaging results that were available during my care of the patient were reviewed by me and considered in my medical decision making (see chart for details).     Allergic reaction - cause unknown, suspected insect. Pt requesting  steroid injection. No hx of DM. 10mg  dex given in office, start 50mg  pred tomorrow x 5 days. May use OTC benadryl Cellulitis - vs possible early erysipelas. Pt admits she cannot take PCN, but amoxicillin and augmentin okay therefore pt will tolerate cephalexin. 500mg  QID x 5 days.   Final Clinical Impressions(s) / UC Diagnoses   Final diagnoses:  Allergic reaction to insect bite  Cellulitis of right upper arm     Discharge Instructions      You have an allergic reaction around the site of the bite, however it also looks as though you are developing a cellulitis. You received a steroid shot in our office. Do not start the prednisone until tomorrow, 05/05/22. Take this once daily with breakfast. Take the cephalexin four times daily, roughly every 6 hours. If any new or worsening symptoms, return for recheck.    ED Prescriptions     Medication Sig Dispense Auth. Provider   predniSONE (DELTASONE) 50 MG tablet Take 1 tablet (50 mg total) by mouth daily with breakfast. 5 tablet Shauntee Karp L, PA   cephALEXin (KEFLEX) 500 MG capsule Take 1 capsule (500 mg total) by mouth 4 (four) times daily for 5 days. 20 capsule Kingson Lohmeyer L, PA      PDMP not reviewed this encounter.   Chaney Malling, [place] 05/04/22 [phone]

## 2022-05-16 ENCOUNTER — Other Ambulatory Visit: Payer: Self-pay | Admitting: Family

## 2022-05-16 NOTE — Telephone Encounter (Signed)
Refill request to change to 90 day and 2 refill supply   citalopram (CELEXA) 40 MG tablet

## 2022-05-27 ENCOUNTER — Other Ambulatory Visit: Payer: Self-pay | Admitting: Family

## 2022-05-27 DIAGNOSIS — F411 Generalized anxiety disorder: Secondary | ICD-10-CM

## 2022-05-28 ENCOUNTER — Telehealth: Payer: Self-pay | Admitting: Family

## 2022-05-28 NOTE — Telephone Encounter (Signed)
Patient would like to know if Audrey Bates received her wellness for from her job that was faxed over on 05/23/2022? She was calling to check on the status of it being completed and sent back.

## 2022-05-30 ENCOUNTER — Encounter: Payer: Self-pay | Admitting: Family

## 2022-05-31 NOTE — Telephone Encounter (Signed)
Spoke with the patient and was informed that the form was due as soon as possible in order for her to receive her discounts. I told her it might not be until first part of next week before we can fax the form back. She was fine with that. Pt said normally her job does this, but this year they had to get their PCP to complete the form. She apologizes and said next annual visit, she will bring what needs to be completed. September 2023 Lipid panel will be fine, and if not she will schedule an appt.

## 2022-05-31 NOTE — Telephone Encounter (Signed)
I have sent pt mychart message with no response as of yet.  I have inquired for the time frame this paperwork was supposed to filled out for it is usually a certain time frame for each.   Please have her also read her mychart message. I can not fill this out until she lets me know time frame bc we only have cholesterol results from 9/23 so I want to make sure this is within the time frame.

## 2022-06-03 NOTE — Telephone Encounter (Signed)
Completed please fax. In outbox.

## 2022-06-03 NOTE — Telephone Encounter (Signed)
Faxed form to [place], and advised patient the form is ready for pick up. Patient would like for me to mail her a copy to her home. Done.

## 2022-07-12 ENCOUNTER — Telehealth: Payer: Self-pay | Admitting: Family

## 2022-07-12 NOTE — Telephone Encounter (Signed)
We will await for tabithas response on this issue when she returns to office

## 2022-07-12 NOTE — Telephone Encounter (Signed)
Patient called in stating that  she is having hot flashes bad.She said that the citalopram (CELEXA) 40 MG tablet  was increased but its not working and she would like to know if theirs anything else that she be possibly prescribed?

## 2022-07-15 NOTE — Telephone Encounter (Signed)
Pt to make appt to discuss further.  Last seen in January.

## 2022-07-16 NOTE — Telephone Encounter (Signed)
Called patient and scheduled appt

## 2022-07-18 ENCOUNTER — Encounter: Payer: Self-pay | Admitting: Family

## 2022-07-18 ENCOUNTER — Ambulatory Visit: Payer: Managed Care, Other (non HMO) | Admitting: Family

## 2022-07-18 VITALS — BP 124/80 | HR 65 | Temp 97.5°F | Ht 63.0 in | Wt 205.2 lb

## 2022-07-18 DIAGNOSIS — N951 Menopausal and female climacteric states: Secondary | ICD-10-CM | POA: Diagnosis not present

## 2022-07-18 DIAGNOSIS — E785 Hyperlipidemia, unspecified: Secondary | ICD-10-CM | POA: Diagnosis not present

## 2022-07-18 DIAGNOSIS — I10 Essential (primary) hypertension: Secondary | ICD-10-CM | POA: Diagnosis not present

## 2022-07-18 DIAGNOSIS — F419 Anxiety disorder, unspecified: Secondary | ICD-10-CM | POA: Diagnosis not present

## 2022-07-18 LAB — LIPID PANEL
Cholesterol: 220 mg/dL — ABNORMAL HIGH (ref 0–200)
HDL: 43.3 mg/dL (ref >39.00)
LDL Cholesterol: 146 mg/dL — ABNORMAL HIGH (ref 0–99)
NonHDL: 176.84
Total CHOL/HDL Ratio: 5
Triglycerides: 152 mg/dL — ABNORMAL HIGH (ref 0.0–149.0)
VLDL: 30.4 mg/dL (ref 0.0–40.0)

## 2022-07-18 LAB — MICROALBUMIN / CREATININE URINE RATIO
Creatinine,U: 144.5 mg/dL
Microalb Creat Ratio: 0.5 mg/g (ref 0.0–30.0)
Microalb, Ur: <0.7 mg/dL (ref 0.0–1.9)

## 2022-07-18 LAB — BASIC METABOLIC PANEL
BUN: 12 mg/dL (ref 6–23)
CO2: 29 mEq/L (ref 19–32)
Calcium: 9.1 mg/dL (ref 8.4–10.5)
Chloride: 100 mEq/L (ref 96–112)
Creatinine, Ser: 0.72 mg/dL (ref 0.40–1.20)
GFR: 95.69 mL/min (ref >60.00)
Glucose, Bld: 89 mg/dL (ref 70–99)
Potassium: 4 mEq/L (ref 3.5–5.1)
Sodium: 136 mEq/L (ref 135–145)

## 2022-07-18 MED ORDER — CITALOPRAM HYDROBROMIDE 20 MG PO TABS: 20.0000 mg | ORAL_TABLET | Freq: Every day | ORAL | 3 refills | Status: DC

## 2022-07-18 MED ORDER — ESTRADIOL 0.025 MG/24HR TD PTWK: 0.0250 mg | MEDICATED_PATCH | TRANSDERMAL | 12 refills | Status: DC

## 2022-07-18 NOTE — Assessment & Plan Note (Signed)
Stable continue losartan 50 mg

## 2022-07-18 NOTE — Progress Notes (Signed)
Established Patient Office Visit  Subjective:      CC:  Chief Complaint  Patient presents with   Hot Flashes    HPI: Audrey Bates is a 52 y.o. female presenting on 07/18/2022 for Hot Flashes . Hot flashes: have since increased celexa to 40 mg once daily but she states since increasing she has noticed increase in the symptoms. She states has been unbearable at time. Fan on the ceiling and fan on the bed just to keep her feeling cool.   Does have h/o hysterectomy, partial, still with ovaries. Was removed due to uterine fibroids. Paternal and maternal aunt with breast cancer, one aunt diagnosed in her 1's.   Mammogram: fibrocystic changes, stable benign left axillary lymph node.    Social history:  Relevant past medical, surgical, family and social history reviewed and updated as indicated. Interim medical history since our last visit reviewed.  Allergies and medications reviewed and updated.  DATA REVIEWED: CHART IN EPIC     ROS: Negative unless specifically indicated above in HPI.    Current Outpatient Medications:    albuterol (VENTOLIN HFA) 108 (90 Base) MCG/ACT inhaler, INHALE 1-2 PUFFS BY MOUTH EVERY 6 HOURS AS NEEDED FOR WHEEZE OR SHORTNESS OF BREATH, Disp: 1 each, Rfl: 2   aspirin-acetaminophen-caffeine (EXCEDRIN MIGRAINE) 250-250-65 MG tablet, Take 1 tablet by mouth every 6 (six) hours as needed., Disp: , Rfl:    busPIRone (BUSPAR) 5 MG tablet, TAKE 1 TABLET BY MOUTH TWICE A DAY, Disp: 180 tablet, Rfl: 1   citalopram (CELEXA) 20 MG tablet, Take 1 tablet (20 mg total) by mouth daily., Disp: 30 tablet, Rfl: 3   estradiol (CLIMARA) 0.025 mg/24hr patch, Place 1 patch (0.025 mg total) onto the skin once a week., Disp: 4 patch, Rfl: 12   ibuprofen (ADVIL) 600 MG tablet, Take 1 tablet by mouth every 6 (six) hours as needed., Disp: , Rfl:    losartan (COZAAR) 50 MG tablet, TAKE 1 TABLET BY MOUTH EVERY DAY, Disp: 90 tablet, Rfl: 1      Objective:    BP 124/80 (BP  Location: Right Arm, Patient Position: Sitting, Cuff Size: Large)   Pulse 65   Temp (!) 97.5 F (36.4 C) (Temporal)   Ht 5\' 3"  (1.6 m)   Wt 205 lb 4 oz (93.1 kg)   LMP 07/02/2016   SpO2 97%   BMI 36.36 kg/m   Wt Readings from Last 3 Encounters:  07/18/22 205 lb 4 oz (93.1 kg)  03/19/22 192 lb 3.2 oz (87.2 kg)  10/25/21 198 lb 4 oz (89.9 kg)    Physical Exam  Physical Exam Constitutional:      General: not in acute distress.    Appearance: Normal appearance. normal weight. is not ill-appearing, toxic-appearing or diaphoretic.  Cardiovascular:     Rate and Rhythm: Normal rate.  Pulmonary:     Effort: Pulmonary effort is normal.  Musculoskeletal:        General: Normal range of motion.  Neurological:     General: No focal deficit present.     Mental Status: alert and oriented to person, place, and time. Mental status is at baseline.  Psychiatric:        Mood and Affect: Mood normal.        Behavior: Behavior normal.        Thought Content: Thought content normal.        Judgment: Judgment normal.        Assessment & Plan:  Hot flashes  due to menopause Assessment & Plan: Trial estradiol patch, placed once weekly on skin.  No personal h/o breast cancer  Transdermal over oral chosen due to HLD and HTN  Orders: -     Estradiol; Place 1 patch (0.025 mg total) onto the skin once a week.  Dispense: 4 patch; Refill: 12  Primary hypertension Assessment & Plan: Stable continue losartan 50 mg  Orders: -     Basic metabolic panel -     Microalbumin / creatinine urine ratio  Dyslipidemia -     Lipid panel -     Basic metabolic panel  Anxiety -     Citalopram Hydrobromide; Take 1 tablet (20 mg total) by mouth daily.  Dispense: 30 tablet; Refill: 3     Return in about 6 weeks (around 08/29/2022) for f/u blood pressure, f/u cholesterol.  Mort Sawyers, MSN, APRN, FNP-[facility]

## 2022-07-18 NOTE — Patient Instructions (Addendum)
   Start climara patch once weekly

## 2022-07-18 NOTE — Assessment & Plan Note (Signed)
Trial estradiol patch, placed once weekly on skin.  No personal h/o breast cancer  Transdermal over oral chosen due to HLD and HTN

## 2022-08-05 ENCOUNTER — Other Ambulatory Visit: Payer: Self-pay | Admitting: Family

## 2022-08-05 DIAGNOSIS — E782 Mixed hyperlipidemia: Secondary | ICD-10-CM

## 2022-08-05 MED ORDER — ATORVASTATIN CALCIUM 10 MG PO TABS: 10.0000 mg | ORAL_TABLET | Freq: Every day | ORAL | 3 refills | Status: AC

## 2022-08-12 ENCOUNTER — Other Ambulatory Visit: Payer: Self-pay | Admitting: Family

## 2022-08-12 DIAGNOSIS — F419 Anxiety disorder, unspecified: Secondary | ICD-10-CM

## 2022-08-29 ENCOUNTER — Other Ambulatory Visit: Payer: Self-pay | Admitting: Family

## 2022-08-29 DIAGNOSIS — I10 Essential (primary) hypertension: Secondary | ICD-10-CM

## 2022-11-19 ENCOUNTER — Encounter: Payer: Self-pay | Admitting: *Deleted

## 2022-11-28 ENCOUNTER — Telehealth: Payer: Self-pay | Admitting: *Deleted

## 2022-11-28 NOTE — Telephone Encounter (Signed)
  Procedure: Colonoscopy  Height: 5'3 Weight: 217lbs        Have you had a colonoscopy before?  no  Do you have family history of colon cancer?  no  Do you have a family history of polyps? Yes, mother  Previous colonoscopy with polyps removed? no  Do you have a history colorectal cancer?   no  Are you diabetic?  no  Do you have a prosthetic or mechanical heart valve? no  Do you have a pacemaker/defibrillator?   no  Have you had endocarditis/atrial fibrillation?  no  Do you use supplemental oxygen/CPAP?  no  Have you had joint replacement within the last 12 months?  no  Do you tend to be constipated or have to use laxatives?  no   Do you have history of alcohol use? If yes, how much and how often.  no  Do you have history or are you using drugs? If yes, what do are you  using?  no  Have you ever had a stroke/heart attack?  no  Have you ever had a heart or other vascular stent placed,?no  Do you take weight loss medication? no  female patients,: have you had a hysterectomy? yes                              are you post menopausal?  yes                              do you still have your menstrual cycle?     Date of last menstrual period?   Do you take any blood-thinning medications such as: (Plavix, aspirin, Coumadin, Aggrenox, Brilinta, Xarelto, Eliquis, Pradaxa, Savaysa or Effient)? no  If yes we need the name, milligram, dosage and who is prescribing doctor:               Current Outpatient Medications  Medication Sig Dispense Refill   atorvastatin (LIPITOR) 10 MG tablet Take 1 tablet (10 mg total) by mouth daily. 90 tablet 3   busPIRone (BUSPAR) 5 MG tablet TAKE 1 TABLET BY MOUTH TWICE A DAY 180 tablet 1   citalopram (CELEXA) 20 MG tablet TAKE 1 TABLET BY MOUTH EVERY DAY 90 tablet 3   losartan (COZAAR) 50 MG tablet TAKE 1 TABLET BY MOUTH EVERY DAY 90 tablet 1   No current facility-administered medications for this visit.    Allergies  Allergen Reactions    Latex    Penicillins Hives and Rash

## 2022-12-15 ENCOUNTER — Other Ambulatory Visit: Payer: Self-pay | Admitting: Family

## 2022-12-15 DIAGNOSIS — F411 Generalized anxiety disorder: Secondary | ICD-10-CM

## 2022-12-18 NOTE — Telephone Encounter (Signed)
OK to schedule. ASA 2.  ?

## 2022-12-19 ENCOUNTER — Encounter: Payer: Self-pay | Admitting: *Deleted

## 2022-12-19 ENCOUNTER — Other Ambulatory Visit: Payer: Self-pay | Admitting: *Deleted

## 2022-12-19 MED ORDER — PEG 3350-KCL-NA BICARB-NACL 420 G PO SOLR: 4000.0000 mL | Freq: Once | ORAL | 0 refills | Status: AC

## 2022-12-19 NOTE — Telephone Encounter (Signed)
Pt has been scheduled for 01/27/23 with Dr.Rourk. Instructions mailed and prep sent to the pharmacy

## 2022-12-24 ENCOUNTER — Encounter: Payer: Self-pay | Admitting: *Deleted

## 2022-12-24 NOTE — Telephone Encounter (Signed)
Referral completed, TCS apt letter sent to PCP

## 2023-01-27 ENCOUNTER — Other Ambulatory Visit: Payer: Self-pay

## 2023-01-27 ENCOUNTER — Ambulatory Visit (HOSPITAL_COMMUNITY): Payer: Managed Care, Other (non HMO) | Admitting: Anesthesiology

## 2023-01-27 ENCOUNTER — Encounter (HOSPITAL_COMMUNITY): Payer: Self-pay | Admitting: Internal Medicine

## 2023-01-27 ENCOUNTER — Ambulatory Visit (HOSPITAL_COMMUNITY)
Admission: RE | Admit: 2023-01-27 | Discharge: 2023-01-27 | Disposition: A | Discharge status: Home / Self Care | Payer: Managed Care, Other (non HMO) | Attending: Internal Medicine | Admitting: Internal Medicine

## 2023-01-27 ENCOUNTER — Encounter (HOSPITAL_COMMUNITY)
Admission: RE | Disposition: A | Discharge status: Home / Self Care | Payer: Self-pay | Source: Home / Self Care | Attending: Internal Medicine

## 2023-01-27 DIAGNOSIS — I10 Essential (primary) hypertension: Secondary | ICD-10-CM | POA: Insufficient documentation

## 2023-01-27 DIAGNOSIS — F419 Anxiety disorder, unspecified: Secondary | ICD-10-CM | POA: Insufficient documentation

## 2023-01-27 DIAGNOSIS — Z83719 Family history of colon polyps, unspecified: Secondary | ICD-10-CM | POA: Insufficient documentation

## 2023-01-27 DIAGNOSIS — Z1211 Encounter for screening for malignant neoplasm of colon: Secondary | ICD-10-CM

## 2023-01-27 DIAGNOSIS — F32A Depression, unspecified: Secondary | ICD-10-CM | POA: Diagnosis not present

## 2023-01-27 HISTORY — DX: Depression, unspecified: F32.A

## 2023-01-27 HISTORY — PX: COLONOSCOPY WITH PROPOFOL: SHX5780

## 2023-01-27 HISTORY — DX: Anxiety disorder, unspecified: F41.9

## 2023-01-27 SURGERY — COLONOSCOPY WITH PROPOFOL
Anesthesia: General

## 2023-01-27 MED ORDER — SODIUM CHLORIDE 0.9 % IV SOLN: INTRAVENOUS | Status: DC | PRN

## 2023-01-27 MED ORDER — PROPOFOL 500 MG/50ML IV EMUL
INTRAVENOUS | Status: DC | PRN
  Administered 2023-01-27: 150 ug/kg/min via INTRAVENOUS

## 2023-01-27 MED ORDER — LACTATED RINGERS IV SOLN: INTRAVENOUS | Status: DC | PRN

## 2023-01-27 MED ORDER — DEXMEDETOMIDINE HCL IN NACL 80 MCG/20ML IV SOLN
INTRAVENOUS | Status: DC | PRN
  Administered 2023-01-27: 8 ug via INTRAVENOUS

## 2023-01-27 MED ORDER — PROPOFOL 10 MG/ML IV BOLUS
INTRAVENOUS | Status: DC | PRN
  Administered 2023-01-27: 50 mg via INTRAVENOUS

## 2023-01-27 MED ORDER — LIDOCAINE HCL (CARDIAC) PF 100 MG/5ML IV SOSY
PREFILLED_SYRINGE | INTRAVENOUS | Status: DC | PRN
  Administered 2023-01-27: 80 mg via INTRATRACHEAL

## 2023-01-27 MED ORDER — STERILE WATER FOR IRRIGATION IR SOLN
Status: DC | PRN
  Administered 2023-01-27: 120 mL

## 2023-01-27 NOTE — Transfer of Care (Signed)
Immediate Anesthesia Transfer of Care Note  Patient: Audrey Bates  Procedure(s) Performed: COLONOSCOPY WITH PROPOFOL  Patient Location: PACU and Endoscopy Unit  Anesthesia Type:MAC  Level of Consciousness: awake and alert   Airway & Oxygen Therapy: Patient Spontanous Breathing and Patient connected to nasal cannula oxygen  Post-op Assessment: Report given to RN and Post -op Vital signs reviewed and stable  Post vital signs: Reviewed and stable  Last Vitals:  Vitals Value Taken Time  BP 108/83 01/27/23 0848  Temp 36.5 C 01/27/23 0848  Pulse 85 01/27/23 0848  Resp 15 01/27/23 0848  SpO2 97 % 01/27/23 0848    Last Pain:  Vitals:   01/27/23 0848  TempSrc: Oral  PainSc: 0-No pain      Patients Stated Pain Goal: 9 (01/27/23 0716)  Complications: No notable events documented.

## 2023-01-27 NOTE — Op Note (Signed)
[facility] Patient Name: Audrey Bates Procedure Date: 01/27/2023 8:12 AM MRN: [id number] Date of Birth: 12-Nov-1970 Attending MD: Gennette Pac , MD, [id number] CSN: [id number] Age: 52 Admit Type: Outpatient Procedure:                Colonoscopy Indications:              Colon cancer screening in patient at increased                            risk: Family history of 1st-degree relative with                            colon polyps Providers:                Gennette Pac, MD, Sheran Fava, Dyann Ruddle Referring MD:              Medicines:                Propofol per Anesthesia Complications:            No immediate complications. Estimated Blood Loss:     Estimated blood loss: none. Procedure:                Pre-Anesthesia Assessment:                           - Prior to the procedure, a History and Physical                            was performed, and patient medications and                            allergies were reviewed. The patient's tolerance of                            previous anesthesia was also reviewed. The risks                            and benefits of the procedure and the sedation                            options and risks were discussed with the patient.                            All questions were answered, and informed consent                            was obtained. Prior Anticoagulants: The patient has                            taken no anticoagulant or antiplatelet agents. ASA                            Grade Assessment:  II - A patient with mild systemic                            disease. After reviewing the risks and benefits,                            the patient was deemed in satisfactory condition to                            undergo the procedure.                           After obtaining informed consent, the colonoscope                            was passed under direct vision. Throughout  the                            procedure, the patient's blood pressure, pulse, and                            oxygen saturations were monitored continuously. The                            [id number]) scope was introduced through the                            anus and advanced to the the cecum, identified by                            appendiceal orifice and ileocecal valve. The                            colonoscopy was performed without difficulty. The                            patient tolerated the procedure well. The quality                            of the bowel preparation was adequate. The                            ileocecal valve, appendiceal orifice, and rectum                            were photographed. Scope In: 8:33:56 AM Scope Out: 8:44:36 AM Scope Withdrawal Time: 0 hours 7 minutes 54 seconds  Total Procedure Duration: 0 hours 10 minutes 40 seconds  Findings:      The perianal and digital rectal examinations were normal.      The colon (entire examined portion) appeared normal.      The exam was otherwise without abnormality on direct and retroflexion       views. Impression:               - The entire examined  colon is normal.                           - The examination was otherwise normal on direct                            and retroflexion views.                           - No specimens collected. Moderate Sedation:      Moderate (conscious) sedation was personally administered by an       anesthesia professional. The following parameters were monitored: oxygen       saturation, heart rate, blood pressure, respiratory rate, EKG, adequacy       of pulmonary ventilation, and response to care. Recommendation:           - Patient has a contact number available for                            emergencies. The signs and symptoms of potential                            delayed complications were discussed with the                            patient. Return to  normal activities tomorrow.                            Written discharge instructions were provided to the                            patient.                           - Advance diet as tolerated.                           - Continue present medications.                           - Repeat colonoscopy in 5 years for screening                            purposes.                           - Return to GI office (date not yet determined). Procedure Code(s):        --- Professional ---                           [phone], Colonoscopy, flexible; diagnostic, including                            collection of specimen(s) by brushing or washing,                            when performed (  separate procedure) Diagnosis Code(s):        --- Professional ---                           Z83.71, Family history of colonic polyps CPT copyright 2022 [employer]. All rights reserved. The codes documented in this report are preliminary and upon coder review may  be revised to meet current compliance requirements. Audrey Friends. Othella Slappey, MD Gennette Pac, MD 01/27/2023 8:50:02 AM This report has been signed electronically. Number of Addenda: 0

## 2023-01-27 NOTE — Anesthesia Preprocedure Evaluation (Signed)
Anesthesia Evaluation  Patient identified by MRN, date of birth, ID band Patient awake    Reviewed: Allergy & Precautions, H&P , NPO status , Patient's Chart, lab work & pertinent test results, reviewed documented beta blocker date and time   Airway Mallampati: II  TM Distance: >3 FB Neck ROM: full    Dental no notable dental hx.    Pulmonary neg pulmonary ROS   Pulmonary exam normal breath sounds clear to auscultation       Cardiovascular Exercise Tolerance: Good hypertension, negative cardio ROS  Rhythm:regular Rate:Normal     Neuro/Psych  PSYCHIATRIC DISORDERS Anxiety Depression    negative neurological ROS  negative psych ROS   GI/Hepatic negative GI ROS, Neg liver ROS,,,  Endo/Other  negative endocrine ROS    Renal/GU negative Renal ROS  negative genitourinary   Musculoskeletal   Abdominal   Peds  Hematology negative hematology ROS (+) Blood dyscrasia, anemia   Anesthesia Other Findings   Reproductive/Obstetrics negative OB ROS                             Anesthesia Physical Anesthesia Plan  ASA: 2  Anesthesia Plan: General   Post-op Pain Management:    Induction:   PONV Risk Score and Plan: Propofol infusion  Airway Management Planned:   Additional Equipment:   Intra-op Plan:   Post-operative Plan:   Informed Consent: I have reviewed the patients History and Physical, chart, labs and discussed the procedure including the risks, benefits and alternatives for the proposed anesthesia with the patient or authorized representative who has indicated his/her understanding and acceptance.     Dental Advisory Given  Plan Discussed with: CRNA  Anesthesia Plan Comments:        Anesthesia Quick Evaluation

## 2023-01-27 NOTE — H&P (Signed)
@LOGO @   Primary Care Physician:  Ardath Sax, FNP Primary Gastroenterologist:  Dr. Jena Gauss  Pre-Procedure History & Physical: HPI:  Audrey Bates is a 52 y.o. female here for first ever screening colonoscopy.  Mother with colonic polyps.  No bowel symptoms.  No family history of colon cancer.  Past Medical History:  Diagnosis Date   Anxiety    Depression    Hyperlipemia    Hypertension    Uterine fibroid     Past Surgical History:  Procedure Laterality Date   ABDOMINAL HYSTERECTOMY     BREAST BIOPSY  2013   fibroadenoma   LAPAROSCOPIC BILATERAL SALPINGECTOMY Bilateral 07/08/2016   still with ovaries, Procedure: LAPAROSCOPIC BILATERAL SALPINGECTOMY;  Surgeon: Ward, Elenora Fender, MD;  Location: [facility] ORS;  Service: Gynecology;  Laterality: Bilateral;   LAPAROSCOPIC HYSTERECTOMY N/A 07/08/2016   Procedure: HYSTERECTOMY TOTAL LAPAROSCOPIC;  Surgeon: Ward, Elenora Fender, MD;  Location: [facility] ORS;  Service: Gynecology;  Laterality: N/A;   MYOMECTOMY VAGINAL APPROACH     TUBAL LIGATION     reanastamosis in 2014   tubal reversal      Prior to Admission medications   Medication Sig Start Date End Date Taking? Authorizing Provider  atorvastatin (LIPITOR) 10 MG tablet Take 1 tablet (10 mg total) by mouth daily. 08/05/22  Yes Dugal, Wyatt Mage, FNP  busPIRone (BUSPAR) 5 MG tablet TAKE 1 TABLET BY MOUTH TWICE A DAY 05/27/22  Yes Dugal, Tabitha, FNP  citalopram (CELEXA) 20 MG tablet TAKE 1 TABLET BY MOUTH EVERY DAY 08/12/22  Yes Dugal, Tabitha, FNP  losartan (COZAAR) 50 MG tablet TAKE 1 TABLET BY MOUTH EVERY DAY 08/29/22  Yes Mort Sawyers, FNP    Allergies as of 12/19/2022 - Review Complete 11/28/2022  Allergen Reaction Noted   Latex  11/23/2013   Penicillins Hives and Rash 11/23/2013    Family History  Problem Relation Age of Onset   Hypertension Mother    Lung cancer Father    Breast cancer Maternal Aunt    Breast cancer Paternal Aunt     Social History   Socioeconomic History    Marital status: Divorced    Spouse name: Not on file   Number of children: Not on file   Years of education: Not on file   Highest education level: Not on file  Occupational History   Not on file  Tobacco Use   Smoking status: Never   Smokeless tobacco: Never  Vaping Use   Vaping status: Never Used  Substance and Sexual Activity   Alcohol use: Yes    Comment: socially/occasionally   Drug use: No   Sexual activity: Yes    Partners: Male  Other Topics Concern   Not on file  Social History Narrative   ** Merged History Encounter ** 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 Has two ddogs.       Spiritual Beliefs: Christian      Lifestyle: walking; diet is good      Social Determinants of Health   Financial Resource Strain: Low Risk  (05/06/2019)   Received from [facility], [facility]   Overall Financial Resource Strain (CARDIA)    Difficulty of Paying Living Expenses: Not hard at all  Food Insecurity: No Food Insecurity (05/06/2019)   Received from [facility], [facility]   Hunger Vital Sign    Worried About Running Out  of Food in the Last Year: Never true    Ran Out of Food in the Last Year: Never true  Transportation Needs: No Transportation Needs (05/06/2019)   Received from [facility], [facility]   [facility] - Transportation    Lack of Transportation (Medical): No    Lack of Transportation (Non-Medical): No  Physical Activity: Insufficiently Active (05/06/2019)   Received from [facility], [facility]   Exercise Vital Sign    Days of Exercise per Week: 2 days    Minutes of Exercise per Session: 50 min  Stress: No Stress Concern Present (05/06/2019)   Received from [facility], [facility]   [facility] - Occupational Stress Questionnaire     Feeling of Stress : Only a little  Social Connections: Unknown (06/30/2021)   Received from [facility], [facility]   Social Network    Social Network: Not on file  Intimate Partner Violence: Unknown (05/22/2021)   Received from [facility], [facility]   HITS    Physically Hurt: Not on file    Insult or Talk Down To: Not on file    Threaten Physical Harm: Not on file    Scream or Curse: Not on file    Review of Systems: See HPI, otherwise negative ROS  Physical Exam: BP 116/70   Pulse 74   Temp 97.9 F (36.6 C) (Oral)   Resp (!) 22   Ht 5\' 3"  (1.6 m)   Wt 98.4 kg   LMP 07/02/2016   SpO2 98%   BMI 38.44 kg/m  General:   Alert,  Well-developed, well-nourished, pleasant and cooperative in NAD Neck:  Supple; no masses or thyromegaly. No significant cervical adenopathy. Lungs:  Clear throughout to auscultation.   No wheezes, crackles, or rhonchi. No acute distress. Heart:  Regular rate and rhythm; no murmurs, clicks, rubs,  or gallops. Abdomen: Non-distended, normal bowel sounds.  Soft and nontender without appreciable mass or hepatosplenomegaly.   Impression/Plan: 52 year old lady here for first ever screening colonoscopy.  Mother with history colonic polyps.  Requires frequent colonoscopies.  I have offered this lady a screening colonoscopy.  The risks, benefits, limitations, alternatives and imponderables have been reviewed with the patient. Questions have been answered. All parties are agreeable.       Notice: This dictation was prepared with Dragon dictation along with smaller phrase technology. Any transcriptional errors that result from this process are unintentional and may not be corrected upon review.

## 2023-01-27 NOTE — OR Nursing (Signed)
Please excuse Audrey Bates on 01/27/2023 for work for providing transportation for her mother as she had a procedure today and cannot drive or operate heavy machinery for the next 24 hours or be left unattended.

## 2023-01-27 NOTE — Discharge Instructions (Signed)
  Colonoscopy Discharge Instructions  Read the instructions outlined below and refer to this sheet in the next few weeks. These discharge instructions provide you with general information on caring for yourself after you leave the hospital. Your doctor may also give you specific instructions. While your treatment has been planned according to the most current medical practices available, unavoidable complications occasionally occur. If you have any problems or questions after discharge, call Dr. Jena Gauss at [phone]. ACTIVITY You may resume your regular activity, but move at a slower pace for the next 24 hours.  Take frequent rest periods for the next 24 hours.  Walking will help get rid of the air and reduce the bloated feeling in your belly (abdomen).  No driving for 24 hours (because of the medicine (anesthesia) used during the test).   Do not sign any important legal documents or operate any machinery for 24 hours (because of the anesthesia used during the test).  NUTRITION Drink plenty of fluids.  You may resume your normal diet as instructed by your doctor.  Begin with a light meal and progress to your normal diet. Heavy or fried foods are harder to digest and may make you feel sick to your stomach (nauseated).  Avoid alcoholic beverages for 24 hours or as instructed.  MEDICATIONS You may resume your normal medications unless your doctor tells you otherwise.  WHAT YOU CAN EXPECT TODAY Some feelings of bloating in the abdomen.  Passage of more gas than usual.  Spotting of blood in your stool or on the toilet paper.  IF YOU HAD POLYPS REMOVED DURING THE COLONOSCOPY: No aspirin products for 7 days or as instructed.  No alcohol for 7 days or as instructed.  Eat a soft diet for the next 24 hours.  FINDING OUT THE RESULTS OF YOUR TEST Not all test results are available during your visit. If your test results are not back during the visit, make an appointment with your caregiver to find out the  results. Do not assume everything is normal if you have not heard from your caregiver or the medical facility. It is important for you to follow up on all of your test results.  SEEK IMMEDIATE MEDICAL ATTENTION IF: You have more than a spotting of blood in your stool.  Your belly is swollen (abdominal distention).  You are nauseated or vomiting.  You have a temperature over 101.  You have abdominal pain or discomfort that is severe or gets worse throughout the day.     Your colonoscopy appeared normal today!  It is recommended you return in 5 years for repeat examination   at patient request, I called TeAndra at [phone] -  reviewed findings and recommendations

## 2023-01-28 NOTE — Anesthesia Postprocedure Evaluation (Signed)
Anesthesia Post Note  Patient: Audrey Bates  Procedure(s) Performed: COLONOSCOPY WITH PROPOFOL  Patient location during evaluation: Phase II Anesthesia Type: General Level of consciousness: awake Pain management: pain level controlled Vital Signs Assessment: post-procedure vital signs reviewed and stable Respiratory status: spontaneous breathing and respiratory function stable Cardiovascular status: blood pressure returned to baseline and stable Postop Assessment: no headache and no apparent nausea or vomiting Anesthetic complications: no Comments: Late entry   No notable events documented.   Last Vitals:  Vitals:   01/27/23 0716 01/27/23 0848  BP: 116/70 108/83  Pulse: 74 85  Resp: (!) 22 15  Temp: 36.6 C 36.5 C  SpO2: 98% 97%    Last Pain:  Vitals:   01/27/23 0848  TempSrc: Oral  PainSc: 0-No pain                 Windell Norfolk

## 2023-02-04 ENCOUNTER — Encounter (HOSPITAL_COMMUNITY): Payer: Self-pay | Admitting: Internal Medicine

## 2023-05-26 ENCOUNTER — Other Ambulatory Visit: Payer: Self-pay | Admitting: Nurse Practitioner

## 2023-05-26 DIAGNOSIS — Z1231 Encounter for screening mammogram for malignant neoplasm of breast: Secondary | ICD-10-CM

## 2023-05-30 ENCOUNTER — Ambulatory Visit
Admission: RE | Admit: 2023-05-30 | Discharge: 2023-05-30 | Disposition: A | Discharge status: Home / Self Care | Source: Ambulatory Visit | Attending: Nurse Practitioner | Admitting: Nurse Practitioner

## 2023-05-30 DIAGNOSIS — Z1231 Encounter for screening mammogram for malignant neoplasm of breast: Secondary | ICD-10-CM | POA: Insufficient documentation
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
## 2023-09-28 IMAGING — DX DG CHEST 2V
2 series · 2 of 2 positions shown · non-contrast
Comparison: 07/20/2017

CLINICAL DATA: Persistent cough and chest congestion

EXAM:
CHEST - 2 VIEW

[chest pa]
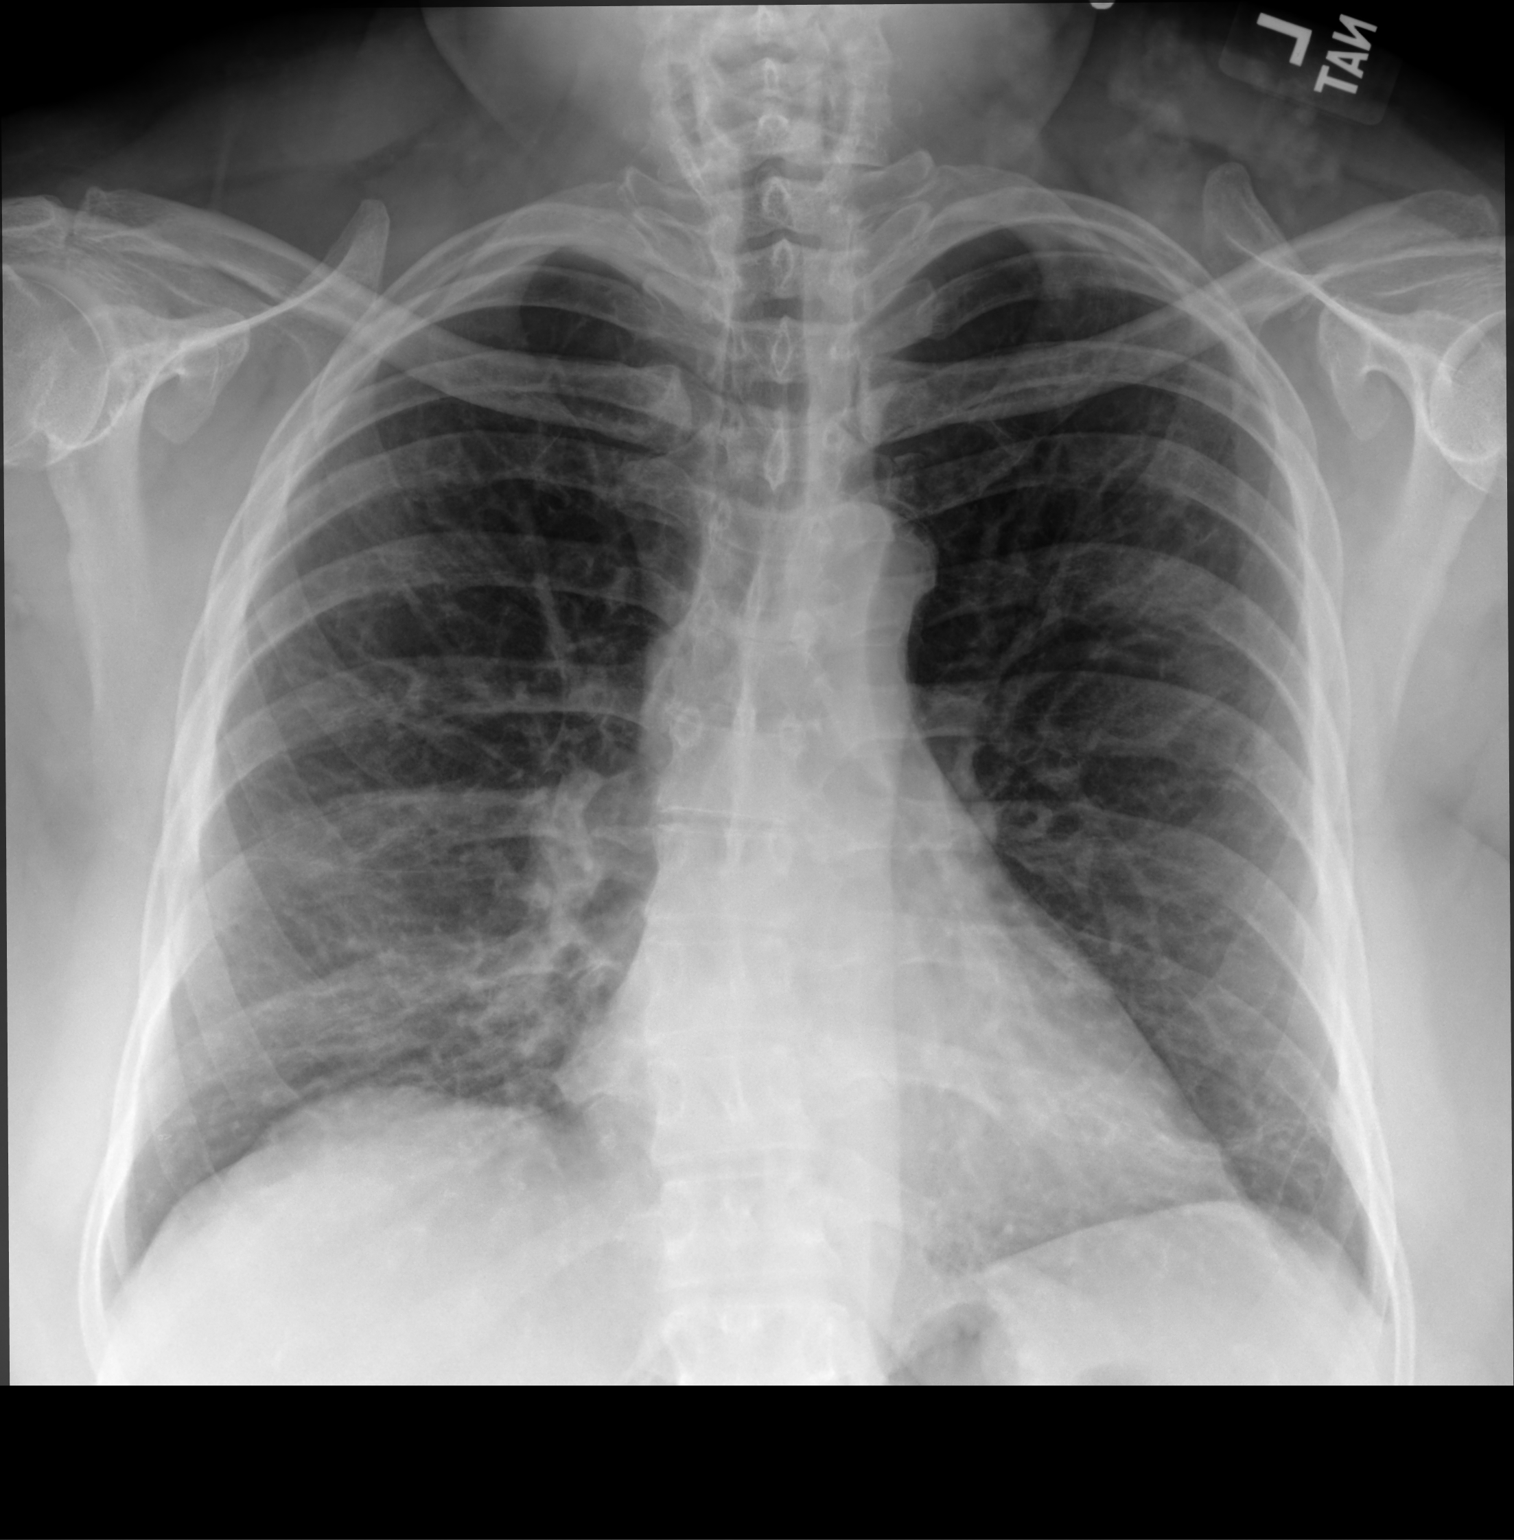

[chest lat]
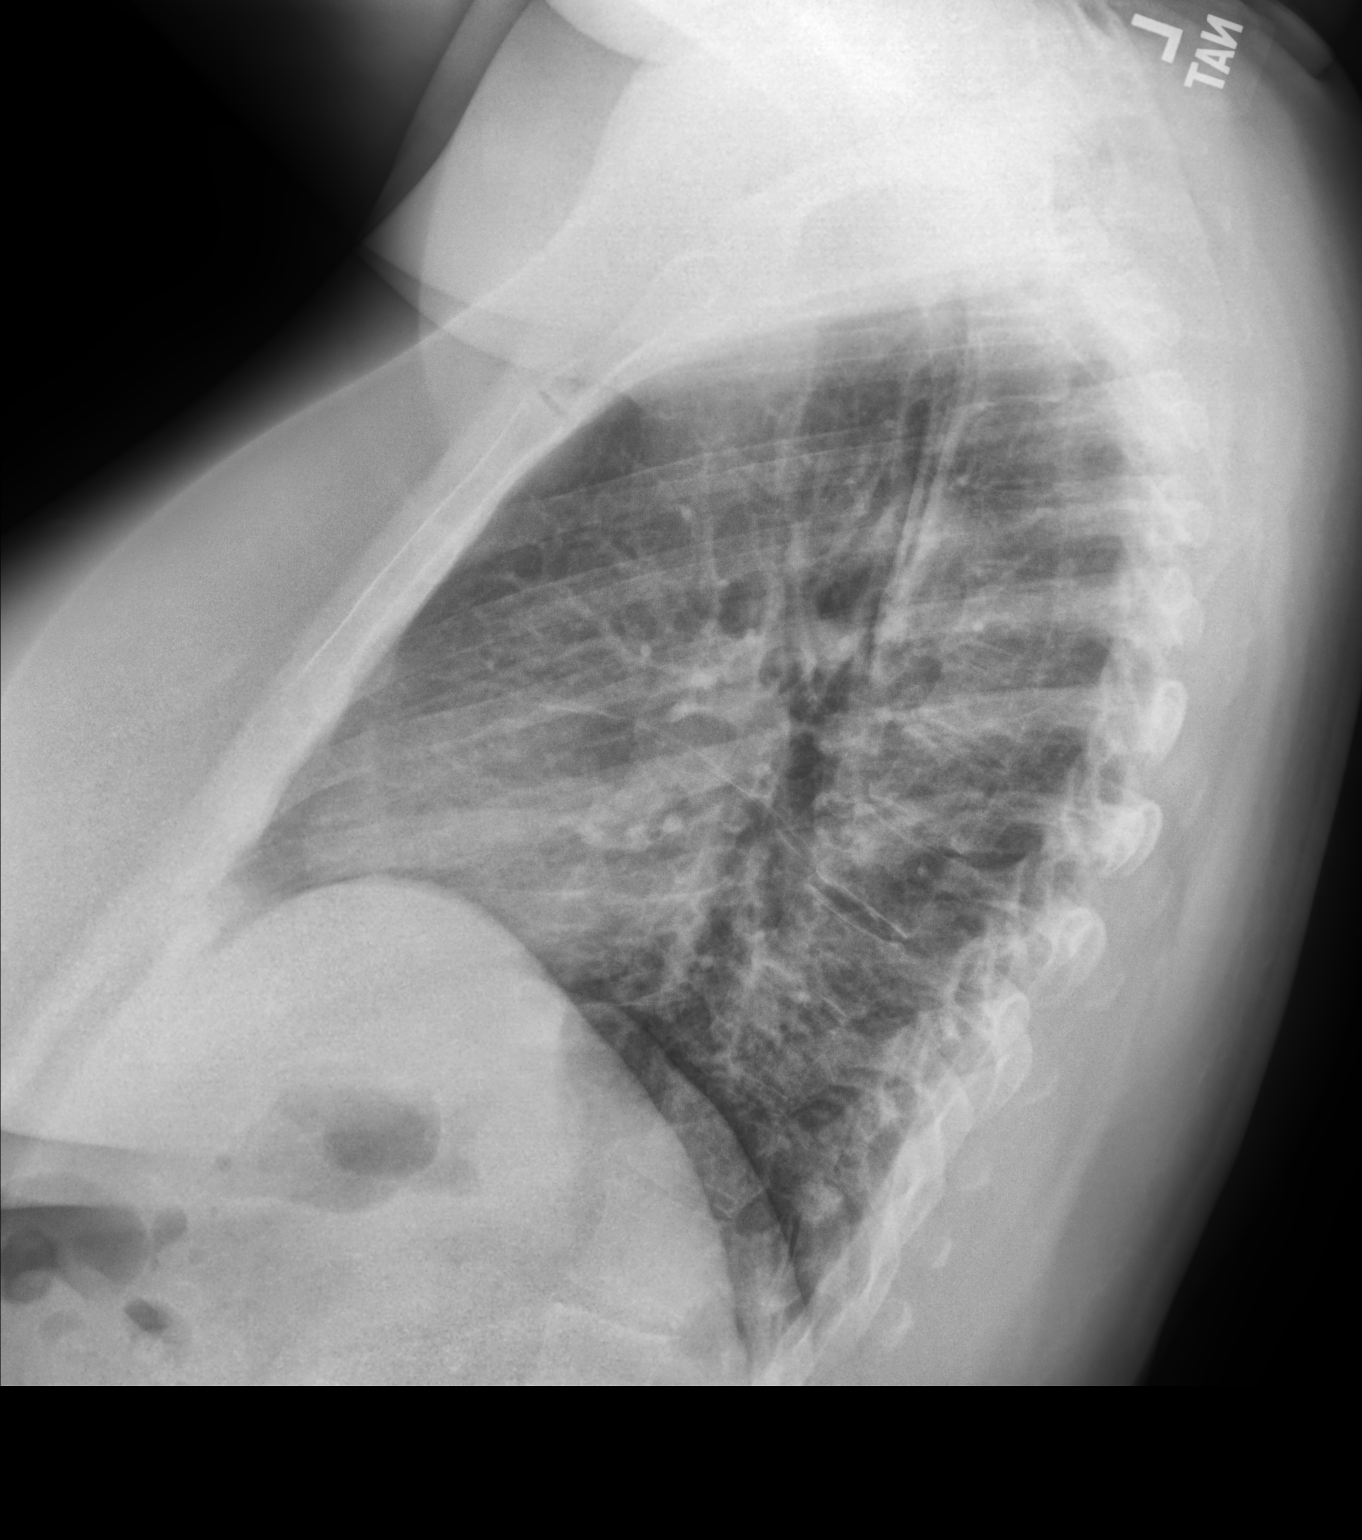

[2 of 2 positions shown; findings below may reference images not displayed]

FINDINGS: Heart size is normal. Mediastinal shadows are normal. There is
widespread bronchial thickening suggesting bronchitis. No
infiltrate, collapse or effusion. No abnormal bone finding. Mild
chronic spinal scoliotic curvature.
IMPRESSION: Widespread bronchial thickening suggesting bronchitis. No
consolidation or collapse.
